# Patient Record
Sex: Male | Born: 1960 | Hispanic: No | Marital: Married | Smoking: Former smoker
Health system: Southern US, Community
[De-identification: ages and names within clinical notes are randomized; demographics above are authoritative.]

## PROBLEM LIST (undated history)

## (undated) DIAGNOSIS — N189 Chronic kidney disease, unspecified: Secondary | ICD-10-CM

## (undated) DIAGNOSIS — I10 Essential (primary) hypertension: Secondary | ICD-10-CM

## (undated) HISTORY — PX: APPENDECTOMY: SHX54

## (undated) HISTORY — DX: Chronic kidney disease, unspecified: N18.9

## (undated) HISTORY — DX: Essential (primary) hypertension: I10

---

## 1998-02-08 ENCOUNTER — Emergency Department (HOSPITAL_COMMUNITY): Admission: EM | Admit: 1998-02-08 | Discharge: 1998-02-08 | Payer: Self-pay | Admitting: Emergency Medicine

## 1998-02-09 ENCOUNTER — Encounter: Admission: RE | Admit: 1998-02-09 | Discharge: 1998-05-10 | Payer: Self-pay | Admitting: Internal Medicine

## 1998-02-12 ENCOUNTER — Encounter: Admission: RE | Admit: 1998-02-12 | Discharge: 1998-02-12 | Payer: Self-pay | Admitting: *Deleted

## 2002-06-03 ENCOUNTER — Emergency Department (HOSPITAL_COMMUNITY): Admission: EM | Admit: 2002-06-03 | Discharge: 2002-06-03 | Payer: Self-pay | Admitting: Emergency Medicine

## 2002-06-03 ENCOUNTER — Encounter: Payer: Self-pay | Admitting: Emergency Medicine

## 2003-03-26 ENCOUNTER — Emergency Department (HOSPITAL_COMMUNITY): Admission: EM | Admit: 2003-03-26 | Discharge: 2003-03-27 | Payer: Self-pay | Admitting: Emergency Medicine

## 2010-04-07 ENCOUNTER — Observation Stay (HOSPITAL_COMMUNITY): Admission: EM | Admit: 2010-04-07 | Discharge: 2010-04-08 | Payer: Self-pay | Admitting: Emergency Medicine

## 2010-04-07 ENCOUNTER — Encounter (INDEPENDENT_AMBULATORY_CARE_PROVIDER_SITE_OTHER): Payer: Self-pay | Admitting: General Surgery

## 2010-09-26 LAB — BASIC METABOLIC PANEL
BUN: 9 mg/dL (ref 6–23)
CO2: 26 mEq/L (ref 19–32)
Calcium: 9 mg/dL (ref 8.4–10.5)
Chloride: 103 mEq/L (ref 96–112)
Creatinine, Ser: 0.78 mg/dL (ref 0.4–1.5)
GFR calc Af Amer: >60 mL/min (ref >60)
GFR calc non Af Amer: >60 mL/min (ref >60)
Glucose, Bld: 113 mg/dL — ABNORMAL HIGH (ref 70–99)
Potassium: 3.7 mEq/L (ref 3.5–5.1)
Sodium: 137 mEq/L (ref 135–145)

## 2010-09-26 LAB — TYPE AND SCREEN
ABO/RH(D): A POS
Antibody Screen: NEGATIVE

## 2010-09-26 LAB — ABO/RH: ABO/RH(D): A POS

## 2013-01-01 ENCOUNTER — Ambulatory Visit: Payer: BC Managed Care – PPO

## 2013-01-01 ENCOUNTER — Ambulatory Visit (INDEPENDENT_AMBULATORY_CARE_PROVIDER_SITE_OTHER): Payer: BC Managed Care – PPO | Admitting: Emergency Medicine

## 2013-01-01 VITALS — BP 159/78 | HR 59 | Temp 97.9°F | Resp 18 | Ht 63.0 in | Wt 132.4 lb

## 2013-01-01 DIAGNOSIS — M1990 Unspecified osteoarthritis, unspecified site: Secondary | ICD-10-CM

## 2013-01-01 DIAGNOSIS — M25562 Pain in left knee: Secondary | ICD-10-CM

## 2013-01-01 DIAGNOSIS — M25569 Pain in unspecified knee: Secondary | ICD-10-CM

## 2013-01-01 MED ORDER — MELOXICAM 15 MG PO TABS: 15.0000 mg | ORAL_TABLET | Freq: Every day | ORAL | Status: DC

## 2013-01-01 NOTE — Patient Instructions (Addendum)
Knee Wear and Tear Disorders of the Knee (Arthritis, Osteoarthritis) Everyone will experience wear and tear injuries (arthritis, osteoarthritis) of the knee. These are the changes we all get as we age. They come from the joint stress of daily living. The amount of cartilage damage in your knee and your symptoms determine if you need surgery. Mild problems require approximately two months recovery time. More severe problems take several months to recover. With mild problems, your surgeon may find worn and rough cartilage surfaces. With severe changes, your surgeon may find cartilage that has completely worn away and exposed the bone. Loose bodies of bone and cartilage, bone spurs (excess bone growth), and injuries to the menisci (cushions between the large bones of your leg) are also common. All of these problems can cause pain. For a mild wear and tear problem, rough cartilage may simply need to be shaved and smoothed. For more severe problems with areas of exposed bone, your surgeon may use an instrument for roughing up the bone surfaces to stimulate new cartilage growth. Loose bodies are usually removed. Torn menisci may be trimmed or repaired. ABOUT THE ARTHROSCOPIC PROCEDURE Arthroscopy is a surgical technique. It allows your orthopedic surgeon to diagnose and treat your knee injury with accuracy. The surgeon looks into your knee through a small scope. The scope is like a small (pencil-sized) telescope. Arthroscopy is less invasive than open knee surgery. You can expect a more rapid recovery. After the procedure, you will be moved to a recovery area until most of the effects of the medication have worn off. Your caregiver will discuss the test results with you. RECOVERY The severity of the arthritis and the type of procedure performed will determine recovery time. Other important factors include age, physical condition, medical conditions, and the type of rehabilitation program. Strengthening your muscles  after arthroscopy helps guarantee a better recovery. Follow your caregiver's instructions. Use crutches, rest, elevate, ice, and do knee exercises as instructed. Your caregivers will help you and instruct you with exercises and other physical therapy required to regain your mobility, muscle strength, and functioning following surgery. Only take over-the-counter or prescription medicines for pain, discomfort, or fever as directed by your caregiver.  SEEK MEDICAL CARE IF:   There is increased bleeding (more than a small spot) from the wound.  You notice redness, swelling, or increasing pain in the wound.  Pus is coming from wound.  You develop an unexplained oral temperature above 102 F (38.9 C) , or as your caregiver suggests.  You notice a foul smell coming from the wound or dressing.  You have severe pain with motion of the knee. SEEK IMMEDIATE MEDICAL CARE IF:   You develop a rash.  You have difficulty breathing.  You have any allergic problems. MAKE SURE YOU:   Understand these instructions.  Will watch your condition.  Will get help right away if you are not doing well or get worse. Document Released: 06/27/2000 Document Revised: 09/22/2011 Document Reviewed: 11/24/2007 [facility] Patient Information 2014 [place], [place].

## 2013-01-01 NOTE — Progress Notes (Signed)
[facility] [address], [place] [address] [phone]  Date:  01/01/2013   Name:  George Hodges   DOB:  09/10/1960   MRN:  [id number]  PCP:  No primary provider on file.    Chief Complaint: Knee Pain   History of Present Illness:  George Hodges is a 52 y.o. very pleasant male patient who presents with the following:  Works and has pain in left knee worse when he descends stairs.  No history of injury.  Says knee swells but does not lock or click.  No pain with weight bearing.  No improvement with over the counter medications or other home remedies. Denies other complaint or health concern today.   There are no active problems to display for this patient.   History reviewed. No pertinent past medical history.  Past Surgical History  Procedure Laterality Date  . Appendectomy      History  Substance Use Topics  . Smoking status: Former Games developer  . Smokeless tobacco: Never Used  . Alcohol Use: Yes     Comment: 1-2 cans of beer daily    No family history on file.  No Known Allergies  Medication list has been reviewed and updated.  No current outpatient prescriptions on file prior to visit.   No current facility-administered medications on file prior to visit.    Review of Systems:  As per HPI, otherwise negative.    Physical Examination: Filed Vitals:   01/01/13 1223  BP: 159/78  Pulse: 59  Temp: 97.9 F (36.6 C)  Resp: 18   Filed Vitals:   01/01/13 1223  Height: 5\' 3"  (1.6 m)  Weight: 132 lb 6.4 oz (60.056 kg)   Body mass index is 23.46 kg/(m^2). Ideal Body Weight: Weight in (lb) to have BMI = 25: 140.8   GEN: WDWN, NAD, Non-toxic, Alert & Oriented x 3 HEENT: Atraumatic, Normocephalic.  Ears and Nose: No external deformity. EXTR: No clubbing/cyanosis/edema NEURO: Normal gait.  PSYCH: Normally interactive. Conversant. Not depressed or anxious appearing.  Calm demeanor.  KNEE:  Left warm and minimally tender.  Full AROM and PROM.   Joint stable.  Assessment and Plan: Degenerative arthritis knee mobic Follow up in one month  [facility] reading (PRIMARY) by  Dr. Dareen Piano.  No osseous injury.   Signed,  Phillips Odor, MD

## 2014-03-08 ENCOUNTER — Encounter (HOSPITAL_COMMUNITY): Payer: BC Managed Care – PPO | Admitting: Certified Registered Nurse Anesthetist

## 2014-03-08 ENCOUNTER — Encounter (HOSPITAL_COMMUNITY)
Admission: EM | Disposition: A | Discharge status: Home / Self Care | Payer: Self-pay | Source: Home / Self Care | Attending: Emergency Medicine

## 2014-03-08 ENCOUNTER — Ambulatory Visit (HOSPITAL_COMMUNITY)
Admission: EM | Admit: 2014-03-08 | Discharge: 2014-03-08 | Disposition: A | Discharge status: Home / Self Care | Payer: BC Managed Care – PPO | Attending: Emergency Medicine | Admitting: Emergency Medicine

## 2014-03-08 ENCOUNTER — Emergency Department (HOSPITAL_COMMUNITY): Payer: BC Managed Care – PPO

## 2014-03-08 ENCOUNTER — Emergency Department (HOSPITAL_COMMUNITY): Payer: BC Managed Care – PPO | Admitting: Certified Registered Nurse Anesthetist

## 2014-03-08 ENCOUNTER — Encounter (HOSPITAL_COMMUNITY): Payer: Self-pay | Admitting: Emergency Medicine

## 2014-03-08 DIAGNOSIS — S66909A Unspecified injury of unspecified muscle, fascia and tendon at wrist and hand level, unspecified hand, initial encounter: Secondary | ICD-10-CM | POA: Diagnosis present

## 2014-03-08 DIAGNOSIS — S5420XA Injury of radial nerve at forearm level, unspecified arm, initial encounter: Secondary | ICD-10-CM | POA: Insufficient documentation

## 2014-03-08 DIAGNOSIS — Y92009 Unspecified place in unspecified non-institutional (private) residence as the place of occurrence of the external cause: Secondary | ICD-10-CM | POA: Insufficient documentation

## 2014-03-08 DIAGNOSIS — Z87891 Personal history of nicotine dependence: Secondary | ICD-10-CM | POA: Diagnosis not present

## 2014-03-08 DIAGNOSIS — S62102B Fracture of unspecified carpal bone, left wrist, initial encounter for open fracture: Secondary | ICD-10-CM

## 2014-03-08 DIAGNOSIS — S61509A Unspecified open wound of unspecified wrist, initial encounter: Secondary | ICD-10-CM | POA: Insufficient documentation

## 2014-03-08 DIAGNOSIS — S52599B Other fractures of lower end of unspecified radius, initial encounter for open fracture type I or II: Secondary | ICD-10-CM | POA: Insufficient documentation

## 2014-03-08 DIAGNOSIS — S61512A Laceration without foreign body of left wrist, initial encounter: Secondary | ICD-10-CM

## 2014-03-08 DIAGNOSIS — W312XXA Contact with powered woodworking and forming machines, initial encounter: Secondary | ICD-10-CM | POA: Diagnosis not present

## 2014-03-08 DIAGNOSIS — S66922A Laceration of unspecified muscle, fascia and tendon at wrist and hand level, left hand, initial encounter: Secondary | ICD-10-CM

## 2014-03-08 HISTORY — PX: TENDON REPAIR: SHX5111

## 2014-03-08 LAB — CBC WITH DIFFERENTIAL/PLATELET
BASOS ABS: 0 10*3/uL (ref 0.0–0.1)
Basophils Relative: 0 % (ref 0–1)
Eosinophils Absolute: 0.2 10*3/uL (ref 0.0–0.7)
Eosinophils Relative: 2 % (ref 0–5)
HEMATOCRIT: 43.8 % (ref 39.0–52.0)
HEMOGLOBIN: 14.4 g/dL (ref 13.0–17.0)
LYMPHS ABS: 2 10*3/uL (ref 0.7–4.0)
LYMPHS PCT: 25 % (ref 12–46)
MCH: 27.5 pg (ref 26.0–34.0)
MCHC: 32.9 g/dL (ref 30.0–36.0)
MCV: 83.6 fL (ref 78.0–100.0)
MONO ABS: 0.8 10*3/uL (ref 0.1–1.0)
MONOS PCT: 10 % (ref 3–12)
NEUTROS ABS: 4.8 10*3/uL (ref 1.7–7.7)
Neutrophils Relative %: 63 % (ref 43–77)
Platelets: 197 10*3/uL (ref 150–400)
RBC: 5.24 MIL/uL (ref 4.22–5.81)
RDW: 13 % (ref 11.5–15.5)
WBC: 7.8 10*3/uL (ref 4.0–10.5)

## 2014-03-08 LAB — BASIC METABOLIC PANEL
ANION GAP: 14 (ref 5–15)
BUN: 16 mg/dL (ref 6–23)
CHLORIDE: 102 meq/L (ref 96–112)
CO2: 24 meq/L (ref 19–32)
CREATININE: 0.9 mg/dL (ref 0.50–1.35)
Calcium: 9.5 mg/dL (ref 8.4–10.5)
GFR calc Af Amer: >90 mL/min (ref >90)
GFR calc non Af Amer: >90 mL/min (ref >90)
GLUCOSE: 88 mg/dL (ref 70–99)
Potassium: 4.1 mEq/L (ref 3.7–5.3)
Sodium: 140 mEq/L (ref 137–147)

## 2014-03-08 SURGERY — TENDON REPAIR
Anesthesia: General | Site: Wrist | Laterality: Left

## 2014-03-08 MED ORDER — LACTATED RINGERS IV SOLN
INTRAVENOUS | Status: DC
  Administered 2014-03-08: 16:00:00 via INTRAVENOUS

## 2014-03-08 MED ORDER — CEFAZOLIN SODIUM 1-5 GM-% IV SOLN
INTRAVENOUS | Status: AC
  Filled 2014-03-08: qty 50

## 2014-03-08 MED ORDER — LIDOCAINE HCL (CARDIAC) 20 MG/ML IV SOLN
INTRAVENOUS | Status: AC
  Filled 2014-03-08: qty 5

## 2014-03-08 MED ORDER — GLYCOPYRROLATE 0.2 MG/ML IJ SOLN
INTRAMUSCULAR | Status: AC
  Filled 2014-03-08: qty 1

## 2014-03-08 MED ORDER — ONDANSETRON HCL 4 MG/2ML IJ SOLN
4.0000 mg | Freq: Once | INTRAMUSCULAR | Status: AC
  Administered 2014-03-08: 4 mg via INTRAVENOUS
  Filled 2014-03-08: qty 2

## 2014-03-08 MED ORDER — GLYCOPYRROLATE 0.2 MG/ML IJ SOLN
INTRAMUSCULAR | Status: DC | PRN
  Administered 2014-03-08 (×2): 0.1 mg via INTRAVENOUS

## 2014-03-08 MED ORDER — MORPHINE SULFATE 4 MG/ML IJ SOLN
4.0000 mg | Freq: Once | INTRAMUSCULAR | Status: AC
  Administered 2014-03-08: 4 mg via INTRAVENOUS
  Filled 2014-03-08: qty 1

## 2014-03-08 MED ORDER — FENTANYL CITRATE 0.05 MG/ML IJ SOLN
INTRAMUSCULAR | Status: AC
  Filled 2014-03-08: qty 2

## 2014-03-08 MED ORDER — FENTANYL CITRATE 0.05 MG/ML IJ SOLN
25.0000 ug | INTRAMUSCULAR | Status: DC | PRN
  Administered 2014-03-08: 50 ug via INTRAVENOUS
  Administered 2014-03-08: 25 ug via INTRAVENOUS
  Administered 2014-03-08: 50 ug via INTRAVENOUS

## 2014-03-08 MED ORDER — FENTANYL CITRATE 0.05 MG/ML IJ SOLN
INTRAMUSCULAR | Status: AC
  Filled 2014-03-08: qty 5

## 2014-03-08 MED ORDER — MIDAZOLAM HCL 2 MG/2ML IJ SOLN
INTRAMUSCULAR | Status: AC
  Filled 2014-03-08: qty 2

## 2014-03-08 MED ORDER — LACTATED RINGERS IV SOLN
INTRAVENOUS | Status: DC | PRN
  Administered 2014-03-08 (×2): via INTRAVENOUS

## 2014-03-08 MED ORDER — OXYCODONE HCL 5 MG PO TABS
ORAL_TABLET | ORAL | Status: AC
  Filled 2014-03-08: qty 1

## 2014-03-08 MED ORDER — PROPOFOL 10 MG/ML IV BOLUS
INTRAVENOUS | Status: AC
  Filled 2014-03-08: qty 20

## 2014-03-08 MED ORDER — PHENYLEPHRINE HCL 10 MG/ML IJ SOLN
INTRAMUSCULAR | Status: DC | PRN
  Administered 2014-03-08 (×2): 80 ug via INTRAVENOUS
  Administered 2014-03-08: 40 ug via INTRAVENOUS
  Administered 2014-03-08 (×3): 80 ug via INTRAVENOUS

## 2014-03-08 MED ORDER — OXYCODONE HCL 5 MG PO TABS
5.0000 mg | ORAL_TABLET | Freq: Once | ORAL | Status: AC | PRN
  Administered 2014-03-08: 5 mg via ORAL

## 2014-03-08 MED ORDER — MIDAZOLAM HCL 5 MG/5ML IJ SOLN
INTRAMUSCULAR | Status: DC | PRN
  Administered 2014-03-08 (×2): 1 mg via INTRAVENOUS

## 2014-03-08 MED ORDER — PROPOFOL 10 MG/ML IV BOLUS
INTRAVENOUS | Status: DC | PRN
  Administered 2014-03-08: 150 mg via INTRAVENOUS

## 2014-03-08 MED ORDER — PHENYLEPHRINE 40 MCG/ML (10ML) SYRINGE FOR IV PUSH (FOR BLOOD PRESSURE SUPPORT)
PREFILLED_SYRINGE | INTRAVENOUS | Status: AC
  Filled 2014-03-08: qty 10

## 2014-03-08 MED ORDER — CEPHALEXIN 500 MG PO CAPS
500.0000 mg | ORAL_CAPSULE | Freq: Four times a day (QID) | ORAL | Status: DC
Start: 2014-03-08 — End: 2015-07-23

## 2014-03-08 MED ORDER — BUPIVACAINE HCL 0.25 % IJ SOLN
INTRAMUSCULAR | Status: DC | PRN
  Administered 2014-03-08: 10 mL

## 2014-03-08 MED ORDER — ONDANSETRON HCL 4 MG/2ML IJ SOLN
INTRAMUSCULAR | Status: DC | PRN
  Administered 2014-03-08: 4 mg via INTRAVENOUS

## 2014-03-08 MED ORDER — CEFAZOLIN SODIUM 1-5 GM-% IV SOLN
1.0000 g | Freq: Once | INTRAVENOUS | Status: AC
  Administered 2014-03-08: 1 g via INTRAVENOUS
  Filled 2014-03-08: qty 50

## 2014-03-08 MED ORDER — VITAMIN C 500 MG PO TABS: 500.0000 mg | ORAL_TABLET | Freq: Every day | ORAL | Status: DC

## 2014-03-08 MED ORDER — SODIUM CHLORIDE 0.9 % IR SOLN
Status: DC | PRN
  Administered 2014-03-08: 1000 mL

## 2014-03-08 MED ORDER — OXYCODONE HCL 5 MG/5ML PO SOLN: 5.0000 mg | Freq: Once | ORAL | Status: AC | PRN

## 2014-03-08 MED ORDER — SODIUM CHLORIDE 0.9 % IJ SOLN
INTRAMUSCULAR | Status: AC
  Filled 2014-03-08: qty 10

## 2014-03-08 MED ORDER — NEOSTIGMINE METHYLSULFATE 10 MG/10ML IV SOLN
INTRAVENOUS | Status: AC
  Filled 2014-03-08: qty 1

## 2014-03-08 MED ORDER — BUPIVACAINE HCL (PF) 0.25 % IJ SOLN
INTRAMUSCULAR | Status: AC
  Filled 2014-03-08: qty 30

## 2014-03-08 MED ORDER — FENTANYL CITRATE 0.05 MG/ML IJ SOLN
INTRAMUSCULAR | Status: DC | PRN
  Administered 2014-03-08: 50 ug via INTRAVENOUS
  Administered 2014-03-08: 25 ug via INTRAVENOUS
  Administered 2014-03-08: 50 ug via INTRAVENOUS
  Administered 2014-03-08 (×3): 25 ug via INTRAVENOUS
  Administered 2014-03-08: 50 ug via INTRAVENOUS

## 2014-03-08 MED ORDER — SUCCINYLCHOLINE CHLORIDE 20 MG/ML IJ SOLN
INTRAMUSCULAR | Status: AC
  Filled 2014-03-08: qty 1

## 2014-03-08 MED ORDER — ROCURONIUM BROMIDE 50 MG/5ML IV SOLN
INTRAVENOUS | Status: AC
  Filled 2014-03-08: qty 1

## 2014-03-08 MED ORDER — CHLORHEXIDINE GLUCONATE 4 % EX LIQD
60.0000 mL | Freq: Once | CUTANEOUS | Status: DC
  Filled 2014-03-08: qty 60

## 2014-03-08 MED ORDER — LIDOCAINE HCL (CARDIAC) 20 MG/ML IV SOLN
INTRAVENOUS | Status: DC | PRN
  Administered 2014-03-08: 50 mg via INTRAVENOUS

## 2014-03-08 MED ORDER — TETANUS-DIPHTH-ACELL PERTUSSIS 5-2.5-18.5 LF-MCG/0.5 IM SUSP
0.5000 mL | Freq: Once | INTRAMUSCULAR | Status: AC
  Administered 2014-03-08: 0.5 mL via INTRAMUSCULAR
  Filled 2014-03-08: qty 0.5

## 2014-03-08 MED ORDER — GLYCOPYRROLATE 0.2 MG/ML IJ SOLN
INTRAMUSCULAR | Status: AC
  Filled 2014-03-08: qty 3

## 2014-03-08 MED ORDER — OXYCODONE-ACETAMINOPHEN 5-325 MG PO TABS: 1.0000 | ORAL_TABLET | ORAL | Status: DC | PRN

## 2014-03-08 MED ORDER — ONDANSETRON HCL 4 MG/2ML IJ SOLN
INTRAMUSCULAR | Status: AC
  Filled 2014-03-08: qty 2

## 2014-03-08 MED ORDER — ONDANSETRON HCL 4 MG/2ML IJ SOLN: 4.0000 mg | Freq: Four times a day (QID) | INTRAMUSCULAR | Status: DC | PRN

## 2014-03-08 MED ORDER — DOCUSATE SODIUM 100 MG PO CAPS: 100.0000 mg | ORAL_CAPSULE | Freq: Two times a day (BID) | ORAL | Status: DC

## 2014-03-08 MED ORDER — CEFAZOLIN SODIUM-DEXTROSE 2-3 GM-% IV SOLR
2.0000 g | INTRAVENOUS | Status: AC
  Administered 2014-03-08: 1 g via INTRAVENOUS

## 2014-03-08 MED ORDER — EPHEDRINE SULFATE 50 MG/ML IJ SOLN
INTRAMUSCULAR | Status: AC
  Filled 2014-03-08: qty 1

## 2014-03-08 SURGICAL SUPPLY — 56 items
BANDAGE ELASTIC 3 VELCRO ST LF (GAUZE/BANDAGES/DRESSINGS) ×2 IMPLANT
BANDAGE ELASTIC 4 VELCRO ST LF (GAUZE/BANDAGES/DRESSINGS) IMPLANT
BNDG CMPR 9X4 STRL LF SNTH (GAUZE/BANDAGES/DRESSINGS) ×1
BNDG CMPR MD 5X2 ELC HKLP STRL (GAUZE/BANDAGES/DRESSINGS) ×1
BNDG COHESIVE 1X5 TAN STRL LF (GAUZE/BANDAGES/DRESSINGS) IMPLANT
BNDG ELASTIC 2 VLCR STRL LF (GAUZE/BANDAGES/DRESSINGS) ×2 IMPLANT
BNDG ESMARK 4X9 LF (GAUZE/BANDAGES/DRESSINGS) ×3 IMPLANT
BNDG GAUZE ELAST 4 BULKY (GAUZE/BANDAGES/DRESSINGS) ×2 IMPLANT
CORDS BIPOLAR (ELECTRODE) ×3 IMPLANT
COVER SURGICAL LIGHT HANDLE (MISCELLANEOUS) ×3 IMPLANT
CUFF TOURNIQUET SINGLE 18IN (TOURNIQUET CUFF) ×3 IMPLANT
CUFF TOURNIQUET SINGLE 24IN (TOURNIQUET CUFF) IMPLANT
DRAPE SURG 17X23 STRL (DRAPES) ×3 IMPLANT
DRSG ADAPTIC 3X8 NADH LF (GAUZE/BANDAGES/DRESSINGS) ×2 IMPLANT
GAUZE SPONGE 2X2 8PLY STRL LF (GAUZE/BANDAGES/DRESSINGS) IMPLANT
GAUZE SPONGE 4X4 12PLY STRL (GAUZE/BANDAGES/DRESSINGS) IMPLANT
GLOVE BIOGEL PI IND STRL 8.5 (GLOVE) ×1 IMPLANT
GLOVE BIOGEL PI INDICATOR 8.5 (GLOVE) ×2
GLOVE SURG ORTHO 8.0 STRL STRW (GLOVE) ×3 IMPLANT
GOWN STRL REUS W/ TWL LRG LVL3 (GOWN DISPOSABLE) ×2 IMPLANT
GOWN STRL REUS W/ TWL XL LVL3 (GOWN DISPOSABLE) ×1 IMPLANT
GOWN STRL REUS W/TWL LRG LVL3 (GOWN DISPOSABLE) ×6
GOWN STRL REUS W/TWL XL LVL3 (GOWN DISPOSABLE) ×3
KIT BASIN OR (CUSTOM PROCEDURE TRAY) ×3 IMPLANT
KIT ROOM TURNOVER OR (KITS) ×3 IMPLANT
MANIFOLD NEPTUNE II (INSTRUMENTS) ×1 IMPLANT
NDL HYPO 25GX1X1/2 BEV (NEEDLE) IMPLANT
NEEDLE HYPO 25GX1X1/2 BEV (NEEDLE) ×3 IMPLANT
NS IRRIG 1000ML POUR BTL (IV SOLUTION) ×3 IMPLANT
PACK ORTHO EXTREMITY (CUSTOM PROCEDURE TRAY) ×3 IMPLANT
PAD ARMBOARD 7.5X6 YLW CONV (MISCELLANEOUS) ×6 IMPLANT
PAD CAST 3X4 CTTN HI CHSV (CAST SUPPLIES) IMPLANT
PAD CAST 4YDX4 CTTN HI CHSV (CAST SUPPLIES) IMPLANT
PADDING CAST COTTON 3X4 STRL (CAST SUPPLIES) ×3
PADDING CAST COTTON 4X4 STRL (CAST SUPPLIES)
PROTECTOR NERVE AXOGARD 3.5X20 (Nerve Graft) ×2 IMPLANT
SOAP 2 % CHG 4 OZ (WOUND CARE) ×3 IMPLANT
SPECIMEN JAR SMALL (MISCELLANEOUS) ×1 IMPLANT
SPLINT FIBERGLASS 3X12 (CAST SUPPLIES) ×2 IMPLANT
SPONGE GAUZE 2X2 STER 10/PKG (GAUZE/BANDAGES/DRESSINGS)
SPONGE GAUZE 4X4 12PLY STER LF (GAUZE/BANDAGES/DRESSINGS) ×2 IMPLANT
SUCTION FRAZIER TIP 10 FR DISP (SUCTIONS) IMPLANT
SUT ETHILON 8 0 BV130 4 (SUTURE) ×2 IMPLANT
SUT FIBERWIRE 4-0 18 TAPR NDL (SUTURE) ×9
SUT MERSILENE 4 0 P 3 (SUTURE) IMPLANT
SUT PROLENE 4 0 PS 2 18 (SUTURE) ×2 IMPLANT
SUT VIC AB 2-0 CT1 27 (SUTURE) ×3
SUT VIC AB 2-0 CT1 TAPERPNT 27 (SUTURE) IMPLANT
SUTURE FIBERWR 4-0 18 TAPR NDL (SUTURE) IMPLANT
SYR CONTROL 10ML LL (SYRINGE) ×2 IMPLANT
TOWEL OR 17X24 6PK STRL BLUE (TOWEL DISPOSABLE) ×3 IMPLANT
TOWEL OR 17X26 10 PK STRL BLUE (TOWEL DISPOSABLE) ×3 IMPLANT
TUBE CONNECTING 12'X1/4 (SUCTIONS) ×1
TUBE CONNECTING 12X1/4 (SUCTIONS) ×1 IMPLANT
UNDERPAD 30X30 INCONTINENT (UNDERPADS AND DIAPERS) ×3 IMPLANT
WATER STERILE IRR 1000ML POUR (IV SOLUTION) ×1 IMPLANT

## 2014-03-08 NOTE — ED Provider Notes (Signed)
Medical screening examination/treatment/procedure(s) were conducted as a shared visit with non-physician practitioner(s) and myself.  I personally evaluated the patient during the encounter.   EKG Interpretation None      George Hodges is a 53 y.o. male here with L wrist injury from power saw. No other injuries. Tetanus update in the ED. On exam, there is complex laceration involving radial side of L wrist. I am able to palpate radial pulse. However, there are tendons exposed. Also slightly dec sensation dorsal aspect of thumb. Xray showed possible radius fracture. Concerned for open fracture. Given ancef. PA called Dr. Melvyn Novas who will take him to the OR.    Richardean Canal, MD 03/08/14 [phone]

## 2014-03-08 NOTE — Anesthesia Postprocedure Evaluation (Signed)
  Anesthesia Post-op Note  Patient: George Hodges  Procedure(s) Performed: Procedure(s): Left Wrist Wound Exploration with Tendon and Nerve Repair (Left)  Patient Location: PACU  Anesthesia Type:General  Level of Consciousness: awake, alert , oriented and patient cooperative  Airway and Oxygen Therapy: Patient Spontanous Breathing  Post-op Pain: mild  Post-op Assessment: Post-op Vital signs reviewed, Patient's Cardiovascular Status Stable, Respiratory Function Stable, Patent Airway and No signs of Nausea or vomiting  Post-op Vital Signs: stable  Last Vitals:  Filed Vitals:   03/08/14 1815  BP:   Pulse: 68  Temp:   Resp: 11    Complications: No apparent anesthesia complications

## 2014-03-08 NOTE — Progress Notes (Signed)
Call made to Daughter [name], she speaks fluent English, Pt. Is [place]  (pt. Speaks some english), wife of pt. Is [place]. Call to Inclusion dept. For interpretor for the wife at the pt.'s request. Not able to have [place] interpreter available. Cordination /w daughter [name]) over the telephone for operative consent, she is in agreeement with surgery, she states her father understands & agrees.  Pt. Signed consent  For surgery& he also signed a release for interpretor to be served by his daughter.

## 2014-03-08 NOTE — ED Notes (Signed)
Place suture cart outside door

## 2014-03-08 NOTE — Op Note (Signed)
George Hodges, George Hodges                   ACCOUNT NO.:  [id number]  MEDICAL RECORD NO.:  [id number]  LOCATION:  [facility]                         FACILITY:  [facility]  PHYSICIAN:  Madelynn Done, MD  DATE OF BIRTH:  [birth year]  DATE OF PROCEDURE:  03/08/2014 DATE OF DISCHARGE:  03/08/2014                              OPERATIVE REPORT   PREOPERATIVE DIAGNOSIS:  Left wrist laceration with tendon nerve involvement.  POSTOPERATIVE DIAGNOSIS:  Left wrist laceration with tendon nerve involvement.  ATTENDING PHYSICIAN:  Madelynn Done, MD, who was scrubbed and present for the entire procedure.  ASSISTANT SURGEON:  None.  ANESTHESIA:  General via LMA.  SURGICAL PROCEDURE: 1. Left wrist traumatic laceration repair 7.5 cm. 2. Left hand extensor pollicis brevis tendon repair. 3. Left hand abductor pollicis longus proper repair. 4. Left hand abductor pollicis longus digastric portion repair. 5. Left wrist extensor tendon, extensor of the forearm, flexor of the     forearm repair, brachioradialis. 6. Open treatment of debridement of skin, subcutaneous tissue, and     bone associated with open distal radius fracture extra-articular     fracture. 7. Open treatment of left wrist extra-articular distal radius fracture     without internal fixation. 8. Left wrist major peripheral nerve repair, superficial branch of the     sensory radial nerve repair.  SURGICAL INDICATIONS:  Mr. Laurich is a right-hand dominant 53 year old gentleman who sustained a injury while at home, a saw to the dorsal aspect of the distal forearm and wrist.  The patient was seen and evaluated in the emergency department and recommended to undergo the above procedure.  Risks, benefits, and alternatives were discussed in detail with the patient and signed informed consent was obtained.  Risks include, but not limited to bleeding, infection, damage to nearby nerves, arteries, or tendons, loss of motion of the wrist and  digits, incomplete relief of symptoms, and need for further surgical intervention.  DESCRIPTION OF PROCEDURE:  The patient was properly identified in the preoperative holding area and marked with a permanent marker made on the left wrist to indicate the correct operative site.  The patient was then brought back to the operating room, placed supine on the anesthesia room table where general anesthesia was administered.  The patient tolerated this well.  A well-padded tourniquet was then placed on the left brachium sealed with 1000 drape.  The left upper extremity was then prepped and draped in normal sterile fashion.  Time-out was called, the correct side was identified, and the procedure was then begun. Attention was then turned to the left wrist were the traumatic laceration was extended both proximally and distally and limb was then elevated and tourniquet insufflated.  Excisional debridement of the skin, subcutaneous tissue, and bone of the open fracture of the distal radius was carried out with the nice sharp knife scissors and rongeurs with excisional debridement removing the small loose bony fragments. The patient did have the distal radius fracture that was not displaced. This was treated in open fashion with debridement without internal fixation.  After excisional debridement of the devitalized tissue, attention was then turned to the tendon repair.  The patient's first dorsal compartment tendons were transected.  The EPB was then repaired with several figure-of-eight 4-0 FiberWire sutures.  The APL proper was then repaired with several horizontal mattress and figure-of-eight FiberWire sutures.  An digastric portion of the APL was also repaired. Three slips of the APL were then repaired with the 4-0 FiberWire suture figure-of-eight in a horizontal mattress sutures.  Prior to the repair of the first dorsal compartment tendons, the brachioradialis, the wrist and forearm flexor was  then repaired with a 2-0 Vicryl suture.  The wound was then thoroughly irrigated.  The superficial branch of the radial nerve was then carefully repaired using the loupe magnifications and the 8-0 nylon suture with direct epineural repair.  The AxoGen nerve wrap was then placed around it and surface was protected very directly over the repair.  The wound was then thoroughly irrigated.  The traumatic laceration was then repaired with 4-0 Vicryl Rapide sutures. Adaptic dressing and sterile compressive bandage were then applied.  The patient was placed in a well-padded thumb spica splint, extubated, and taken to recovery room in good condition.  POSTPROCEDURE PLAN:  The patient discharged home, seen back in the office in approximately 9-10 days for wound check, application and then begin a postoperative APL and EPB repair protocol with the therapist radiographs at the first visit.     Madelynn Done, MD     FWO/MEDQ  D:  03/08/2014  T:  03/08/2014  Job:  [id number]

## 2014-03-08 NOTE — Brief Op Note (Signed)
03/08/2014  4:36 PM  PATIENT:  George Hodges  53 y.o. male  PRE-OPERATIVE DIAGNOSIS:  Laceration Left Wrist  POST-OPERATIVE DIAGNOSIS:  Laceration Left Wrist  PROCEDURE:  Procedure(s): LACERATION AND REPAIR/ LEFT WRIST (Left)  SURGEON:  Surgeon(s) and Role:    * Sharma Covert, MD - Primary  PHYSICIAN ASSISTANT: NONE  ASSISTANTS: none   ANESTHESIA:   general  EBL:     BLOOD ADMINISTERED:none  DRAINS: none   LOCAL MEDICATIONS USED:  MARCAINE    and NONE  SPECIMEN:  No Specimen  DISPOSITION OF SPECIMEN:  N/A  COUNTS:  YES  TOURNIQUET:    DICTATION: .161096  PLAN OF CARE: Discharge to home after PACU  PATIENT DISPOSITION:  PACU - hemodynamically stable.   Delay start of Pharmacological VTE agent (>24hrs) due to surgical blood loss or risk of bleeding: not applicable

## 2014-03-08 NOTE — ED Provider Notes (Signed)
CSN: [id number]     Arrival date & time 03/08/14  1124 History   First MD Initiated Contact with Patient 03/08/14 1138     Chief Complaint  Patient presents with  . Extremity Laceration     (Consider location/radiation/quality/duration/timing/severity/associated sxs/prior Treatment) HPI  53 year old male presents for evaluation of wrist lac due to power saw. Hx obtain through pt but daughter who is at bedside who also helped with translation.  Approximately 1-2 hrs ago he was at home cutting and trimming tree with a power saw when he accidentally cut his L wrist.  Report acute onset of sharp, 10/10, non radiating pain to affected site.  Report numbness to L thumb s/p injury.  Pt is R handed, does not recall last tetanus shot, no other injury.  Bleeding was controlled.  Not on blood thinner medication.    History reviewed. No pertinent past medical history. Past Surgical History  Procedure Laterality Date  . Appendectomy     History reviewed. No pertinent family history. History  Substance Use Topics  . Smoking status: Former Games developer  . Smokeless tobacco: Never Used  . Alcohol Use: Yes     Comment: 1-2 cans of beer daily    Review of Systems  Constitutional: Negative for fever.  Skin: Positive for wound.  Neurological: Positive for numbness.      Allergies  Review of patient's allergies indicates no known allergies.  Home Medications   Prior to Admission medications   Medication Sig Start Date End Date Taking? Authorizing Provider  meloxicam (MOBIC) 15 MG tablet Take 1 tablet (15 mg total) by mouth daily. 01/01/13   Carmelina Dane, MD   BP 174/61  Pulse 67  Temp(Src) 97.6 F (36.4 C) (Oral)  Resp 19  Ht  (1.626 m)  Wt 135 lb (61.236 kg)  BMI 23.16 kg/m2  SpO2 98% Physical Exam  Constitutional: He appears well-developed and well-nourished. No distress.  HENT:  Head: Atraumatic.  Eyes: Conjunctivae are normal.  Neck: Normal range of motion. Neck supple.    Musculoskeletal:       Arms: Neurological: He is alert.  Skin: No rash noted.  L wrist: 6cm deep oblique laceration to medial region extending dorsally.  Laceration is adjacent to radial pulse without radial artery involvement. Brachialradialis tendon exposed and injured. Suspect flexor carpi radialis tendon injury. Loss of sensation to L thumb dorsally but normal sensation to palmar aspect.   Decreased L thumb ROM 2/2 pain.  No fb noted.    Psychiatric: He has a normal mood and affect.    ED Course  Procedures (including critical care time)  Pt with deep lac to L medial wrist extended dorsally.  Paresthesia to L thumb indicating suspect nerve injury. 2 tendons were exposed and lacerated.  Xray ordered, tdap given, ancef started, area was locally anesthetized for comfort.  Care discussed with Dr. Silverio Lay.  Plan to consult hand specialist for further care.  Pain medication given.    1:10 PM i have consulted with hand specialist, Dr. Melvyn Novas, who agrees to evaluate pt and will take pt to OR for repair. He request basic labs.  Will obtain CBC, BMP, pt kept NPO.  Wound dressed for protection.  Xray demonstrates soft tissue and distal left radial osseus injury to the radial border of the distal radius with multiple bone fragments identified at wound.            Labs Review Labs Reviewed  CBC WITH DIFFERENTIAL  BASIC  METABOLIC PANEL    Imaging Review Dg Wrist Complete Left  03/08/2014   CLINICAL DATA:  Power saw injury to LEFT wrist  EXAM: LEFT WRIST - COMPLETE 3+ VIEW  COMPARISON:  None  FINDINGS: Soft tissue deformity at the radial aspect of the RIGHT wrist to the level of the proximal carpal row and distal radius.  Multiple bone fragments are identified adjacent to the radial border of the distal radius compatible with osseous injury.  Joint spaces preserved.  No additional fracture, dislocation, or bone destruction.  IMPRESSION: Soft tissue and distal LEFT radial osseous injury to the  radial border of the distal radius with multiple bone fragments identified at wound.   Electronically Signed   By: Ulyses Southward M.D.   On: 03/08/2014 13:18     EKG Interpretation None      MDM   Final diagnoses:  Laceration of left wrist with tendon involvement, initial encounter  Open wrist fracture, left, initial encounter    BP 174/61  Pulse 67  Temp(Src) 97.6 F (36.4 C) (Oral)  Resp 19  Ht  (1.626 m)  Wt 135 lb (61.236 kg)  BMI 23.16 kg/m2  SpO2 98%  I have reviewed nursing notes and vital signs. I personally reviewed the imaging tests through PACS system  I reviewed available ER/hospitalization records thought the EMR     Fayrene Helper, [place] 03/08/14 1341

## 2014-03-08 NOTE — ED Notes (Signed)
Patient transported to X-ray 

## 2014-03-08 NOTE — Discharge Instructions (Signed)
KEEP BANDAGE CLEAN AND DRY CALL OFFICE FOR F/U APPT [phone] IN 8 DAYS ALSO CALL FOR THERAPY APPOINTMENT [phone] EXT 1601 KEEP HAND ELEVATED ABOVE HEART OK TO APPLY ICE TO OPERATIVE AREA CONTACT OFFICE IF ANY WORSENING PAIN OR CONCERNS.

## 2014-03-08 NOTE — H&P (Signed)
George Hodges is an 53 y.o. male.   Chief Complaint: Left wrist laceration HPI: Pt at home sustained injury to dorsum/radial aspect of left forearm Pt seen/evaluated in ED Pt here for surgery on left forearm No prior surgery to left arm/forearm  History reviewed. No pertinent past medical history.  Past Surgical History  Procedure Laterality Date  . Appendectomy      History reviewed. No pertinent family history. Social History:  reports that he has quit smoking. He has never used smokeless tobacco. He reports that he drinks alcohol. He reports that he does not use illicit drugs.  Allergies: No Known Allergies  No prescriptions prior to admission    Results for orders placed during the hospital encounter of 03/08/14 (from the past 48 hour(s))  CBC WITH DIFFERENTIAL     Status: None   Collection Time    03/08/14  1:09 PM      Result Value Ref Range   WBC 7.8  4.0 - 10.5 K/uL   RBC 5.24  4.22 - 5.81 MIL/uL   Hemoglobin 14.4  13.0 - 17.0 g/dL   HCT 43.8  39.0 - 52.0 %   MCV 83.6  78.0 - 100.0 fL   MCH 27.5  26.0 - 34.0 pg   MCHC 32.9  30.0 - 36.0 g/dL   RDW 13.0  11.5 - 15.5 %   Platelets 197  150 - 400 K/uL   Neutrophils Relative % 63  43 - 77 %   Neutro Abs 4.8  1.7 - 7.7 K/uL   Lymphocytes Relative 25  12 - 46 %   Lymphs Abs 2.0  0.7 - 4.0 K/uL   Monocytes Relative 10  3 - 12 %   Monocytes Absolute 0.8  0.1 - 1.0 K/uL   Eosinophils Relative 2  0 - 5 %   Eosinophils Absolute 0.2  0.0 - 0.7 K/uL   Basophils Relative 0  0 - 1 %   Basophils Absolute 0.0  0.0 - 0.1 K/uL  BASIC METABOLIC PANEL     Status: None   Collection Time    03/08/14  1:09 PM      Result Value Ref Range   Sodium 140  137 - 147 mEq/L   Potassium 4.1  3.7 - 5.3 mEq/L   Chloride 102  96 - 112 mEq/L   CO2 24  19 - 32 mEq/L   Glucose, Bld 88  70 - 99 mg/dL   BUN 16  6 - 23 mg/dL   Creatinine, Ser 0.90  0.50 - 1.35 mg/dL   Calcium 9.5  8.4 - 10.5 mg/dL   GFR calc non Af Amer >90  >90 mL/min   GFR calc  Af Amer >90  >90 mL/min   Comment: (NOTE)     The eGFR has been calculated using the CKD EPI equation.     This calculation has not been validated in all clinical situations.     eGFR's persistently <90 mL/min signify possible Chronic Kidney     Disease.   Anion gap 14  5 - 15   Dg Wrist Complete Left  03/08/2014   CLINICAL DATA:  Power saw injury to LEFT wrist  EXAM: LEFT WRIST - COMPLETE 3+ VIEW  COMPARISON:  None  FINDINGS: Soft tissue deformity at the radial aspect of the RIGHT wrist to the level of the proximal carpal row and distal radius.  Multiple bone fragments are identified adjacent to the radial border of the distal radius compatible with  osseous injury.  Joint spaces preserved.  No additional fracture, dislocation, or bone destruction.  IMPRESSION: Soft tissue and distal LEFT radial osseous injury to the radial border of the distal radius with multiple bone fragments identified at wound.   Electronically Signed   By: Lavonia Dana M.D.   On: 03/08/2014 13:18    ROS NO RECENT ILLNESSES OR HOSPITALIZATIONS  Blood pressure 158/66, pulse 56, temperature 98.1 F (36.7 C), temperature source Oral, resp. rate 19, height $RemoveBe'5\' 4"'qciKsUoWr$  (1.626 m), weight 61.236 kg (135 lb), SpO2 96.00%. Physical Exam  General Appearance:  Alert, cooperative, no distress, appears stated age  Head:  Normocephalic, without obvious abnormality, atraumatic  Eyes:  Pupils equal, conjunctiva/corneas clear,         Throat: Lips, mucosa, and tongue normal; teeth and gums normal  Neck: No visible masses     Lungs:   respirations unlabored  Chest Wall:  No tenderness or deformity  Heart:  Regular rate and rhythm,  Abdomen:   Soft, non-tender,         Extremities: LEFT FOREARM WOUND DOCUMENTED IN CHART BANDAGED NOT REMOVED FINGERS WARM WELL PERFUSED LIMITED THUMB MOBILITY AND POOR WRIST MOTION   Pulses: 2+ and symmetric  Skin: Skin color, texture, turgor normal, no rashes or lesions     Neurologic: Normal     Assessment/Plan LEFT FOREARM LACERATION WITH NERVE AND TENDON INVOLVEMENT  LEFT FOREARM WOUND EXPLORATION AND REPAIR OF INDICATED STRUCTURES  R/B/A DISCUSSED WITH PT IN OFFICE.  PT VOICED UNDERSTANDING OF PLAN CONSENT SIGNED DAY OF SURGERY PT SEEN AND EXAMINED PRIOR TO OPERATIVE PROCEDURE/DAY OF SURGERY SITE MARKED. QUESTIONS ANSWERED WILL GO HOME FOLLOWING SURGERY  WE ARE PLANNING SURGERY FOR YOUR UPPER EXTREMITY. THE RISKS AND BENEFITS OF SURGERY INCLUDE BUT NOT LIMITED TO BLEEDING INFECTION, DAMAGE TO NEARBY NERVES ARTERIES TENDONS, FAILURE OF SURGERY TO ACCOMPLISH ITS INTENDED GOALS, PERSISTENT SYMPTOMS AND NEED FOR FURTHER SURGICAL INTERVENTION. WITH THIS IN MIND WE WILL PROCEED. I HAVE DISCUSSED WITH THE PATIENT THE PRE AND POSTOPERATIVE REGIMEN AND THE DOS AND DON'TS. PT VOICED UNDERSTANDING AND INFORMED CONSENT SIGNED.  [name] 03/08/2014, 4:33 PM

## 2014-03-08 NOTE — ED Notes (Signed)
Per pt cut left wrist with power saw. Laceration to the bone with bleeding controled. CNS intact.

## 2014-03-08 NOTE — Transfer of Care (Signed)
Immediate Anesthesia Transfer of Care Note  Patient: George Hodges  Procedure(s) Performed: Procedure(s): Left Wrist Wound Exploration with Tendon and Nerve Repair (Left)  Patient Location: PACU  Anesthesia Type:General  Level of Consciousness: awake, alert , oriented, patient cooperative and responds to stimulation  Airway & Oxygen Therapy: Patient Spontanous Breathing and Patient connected to nasal cannula oxygen  Post-op Assessment: Report given to PACU RN and Post -op Vital signs reviewed and stable  Post vital signs: Reviewed and stable  Complications: No apparent anesthesia complications

## 2014-03-08 NOTE — Anesthesia Preprocedure Evaluation (Addendum)
Anesthesia Evaluation  Patient identified by MRN, date of birth, ID band Patient awake    Reviewed: Allergy & Precautions, H&P , NPO status , Patient's Chart, lab work & pertinent test results  Airway Mallampati: II TM Distance: >3 FB Neck ROM: Full    Dental  (+) Poor Dentition, Dental Advisory Given   Pulmonary former smoker,  breath sounds clear to auscultation        Cardiovascular negative cardio ROS  Rhythm:Regular Rate:Normal     Neuro/Psych negative neurological ROS     GI/Hepatic negative GI ROS, Neg liver ROS,   Endo/Other  negative endocrine ROS  Renal/GU negative Renal ROS     Musculoskeletal   Abdominal Normal abdominal exam  (+)   Peds  Hematology   Anesthesia Other Findings   Reproductive/Obstetrics                          Anesthesia Physical Anesthesia Plan  ASA: II  Anesthesia Plan: General   Post-op Pain Management:    Induction: Intravenous  Airway Management Planned: Oral ETT  Additional Equipment:   Intra-op Plan:   Post-operative Plan: Extubation in OR  Informed Consent: I have reviewed the patients History and Physical, chart, labs and discussed the procedure including the risks, benefits and alternatives for the proposed anesthesia with the patient or authorized representative who has indicated his/her understanding and acceptance.   Dental advisory given  Plan Discussed with: Anesthesiologist and Surgeon  Anesthesia Plan Comments:         Anesthesia Quick Evaluation

## 2014-03-09 NOTE — OR Nursing (Signed)
Late entry on 03-09-14 by Timoteo Expose, RN to add [employer] ID# to Nerve protector implant screen.

## 2014-03-10 ENCOUNTER — Encounter (HOSPITAL_COMMUNITY): Payer: Self-pay | Admitting: Orthopedic Surgery

## 2015-07-16 ENCOUNTER — Ambulatory Visit (INDEPENDENT_AMBULATORY_CARE_PROVIDER_SITE_OTHER): Payer: BLUE CROSS/BLUE SHIELD | Admitting: Family Medicine

## 2015-07-16 VITALS — BP 130/72 | HR 81 | Temp 98.6°F | Resp 18 | Ht 63.0 in | Wt 135.0 lb

## 2015-07-16 DIAGNOSIS — M5416 Radiculopathy, lumbar region: Secondary | ICD-10-CM

## 2015-07-16 DIAGNOSIS — M5417 Radiculopathy, lumbosacral region: Secondary | ICD-10-CM

## 2015-07-16 MED ORDER — PREDNISONE 20 MG PO TABS: ORAL_TABLET | ORAL | Status: DC

## 2015-07-16 MED ORDER — NAPROXEN 500 MG PO TABS: 500.0000 mg | ORAL_TABLET | Freq: Two times a day (BID) | ORAL | Status: DC

## 2015-07-16 MED ORDER — CYCLOBENZAPRINE HCL 5 MG PO TABS: 5.0000 mg | ORAL_TABLET | Freq: Three times a day (TID) | ORAL | Status: DC | PRN

## 2015-07-16 NOTE — Patient Instructions (Signed)
Take the muscle relaxant, cyclobenzaprine, 1 pill 3 times daily. When you return to work you can decrease the dose and just take it at bedtime.  Take the anti-inflammatory pain reliever, naproxen, 500 mg 2 times daily with breakfast and supper  Take the prednisone 20 mg after breakfast 3 pills daily for 2 days, then 2 daily for 2 days, then 1 daily for 2 days, then one half daily for 4 days.  Avoid heavy lifting and straining. However I do recommend that you do some gentle stretching exercises several times a day.  Plan to stay off work the next 4 days to let the medicine have a chance to be taking effect.  If not much improved by this weekend please return for a recheck. If worse at any time come back.

## 2015-07-16 NOTE — Progress Notes (Signed)
Patient ID: George Hodges, male    DOB: [birth year]  Age: 55 y.o. MRN: [id number]  Chief Complaint  Patient presents with  . Back Pain    x2 weeks   . Leg Pain    rt    Subjective:   55 year old man who comes in with two-week history of low back pain in the right low back radiating down the right leg. 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 With rain he has had to be standing and muddy water and eating. That has required extra exertion, and he thinks that may cause his injury. He did not have a specific single injury, just an overuse type problem with back pain. It is radiated down his right leg. They have used home remedy treating him with cupping. He is [place] from [place]  His daughter accompanying him here today. It has been maybe 10 years since he has had back problems.  Current allergies, medications, problem list, past/family and social histories reviewed.  Objective:  BP 130/72 mmHg  Pulse 81  Temp(Src) 98.6 F (37 C) (Oral)  Resp 18  Ht 5\' 3"  (1.6 m)  Wt 135 lb (61.236 kg)  BMI 23.92 kg/m2  SpO2 98%  No major acute distress. Abdomen is soft. He is not tender in the spine or main part of the back, but more down in the right SI joint and extreme low back. Straight leg raising test is positive on the right, though very limber he gets radicular pain when up to about 80. He has no pain with rotation of the hip or. Flexion of the hip. Deep tendon reflexes are 1+ and symmetrical in the knee and ankle. Sensory grossly normal. On flexion, extension, side-to-side tilt, and rotation he gets pain.  Assessment & Plan:   Assessment: 1. Lumbar back pain with radiculopathy affecting right lower extremity       Plan: Will treat with anti-inflammatory medication, muscle relaxant at night, and a tapering course of steroids. If he is not improving with that then might require additional workup.   Meds ordered this encounter  Medications  .  acetaminophen (TYLENOL) 500 MG tablet    Sig: Take 500 mg by mouth every 6 (six) hours as needed.  . predniSONE (DELTASONE) 20 MG tablet    Sig: Take 3 pills daily for 2 days, then 2 daily for 2 days, then 1 daily for 2 days, then one half daily. Best taken after breakfast.    Dispense:  14 tablet    Refill:  0  . naproxen (NAPROSYN) 500 MG tablet    Sig: Take 1 tablet (500 mg total) by mouth 2 (two) times daily with a meal.    Dispense:  30 tablet    Refill:  0  . cyclobenzaprine (FLEXERIL) 5 MG tablet    Sig: Take 1 tablet (5 mg total) by mouth 3 (three) times daily as needed for muscle spasms.    Dispense:  30 tablet    Refill:  1         Patient Instructions  Take the muscle relaxant, cyclobenzaprine, 1 pill 3 times daily. When you return to work you can decrease the dose and just take it at bedtime.  Take the anti-inflammatory pain reliever, naproxen, 500 mg 2 times daily with breakfast and supper  Take the prednisone 20 mg after breakfast 3 pills daily for 2 days, then 2 daily for 2 days, then 1 daily for 2 days,  then one half daily for 4 days.  Avoid heavy lifting and straining. However I do recommend that you do some gentle stretching exercises several times a day.  Plan to stay off work the next 4 days to let the medicine have a chance to be taking effect.  If not much improved by this weekend please return for a recheck. If worse at any time come back.     Return in about 5 days (around 07/21/2015), or if symptoms worsen or fail to improve.   Eloina Ergle, MD 07/16/2015

## 2015-07-23 ENCOUNTER — Ambulatory Visit (INDEPENDENT_AMBULATORY_CARE_PROVIDER_SITE_OTHER): Payer: BLUE CROSS/BLUE SHIELD | Admitting: Family Medicine

## 2015-07-23 VITALS — BP 122/72 | HR 79 | Temp 98.7°F | Resp 17 | Ht 65.0 in | Wt 134.0 lb

## 2015-07-23 DIAGNOSIS — M25561 Pain in right knee: Secondary | ICD-10-CM | POA: Diagnosis not present

## 2015-07-23 MED ORDER — MELOXICAM 15 MG PO TABS: 15.0000 mg | ORAL_TABLET | Freq: Every day | ORAL | Status: DC | PRN

## 2015-07-23 NOTE — Progress Notes (Signed)
   Subjective:    Patient ID: George Hodges, male    DOB: 1960-08-01, 55 y.o.   MRN: [id number]  HPI This is a pleasant 55 yo male who is accompanied by his daughter. He was seen last week with back pain and pain into right leg. He has had some improvement in his back pain, but right knee pain is worse and he feels like it is cold. Sitting and lying bothers his right hip and low back and standing and walking bother his knee. No falls. No weakness. Has some tingling. Pain is achy. He was given prednisone and is almost finished with taper. Takes naproxen (prn) and cyclobenzaprine (BID) with some relief. Pain has been worse with cold, wet weather. He works outside and Tax inspectordigs frequently, kneels on right knee.   No past medical history on file. Past Surgical History  Procedure Laterality Date  . Appendectomy    . Tendon repair Left 03/08/2014    Procedure: Left Wrist Wound Exploration with Tendon and Nerve Repair;  Surgeon: Sharma CovertFred W Ortmann, MD;  Location: [facility] OR;  Service: Orthopedics;  Laterality: Left;   No family history on file. Social History  Substance Use Topics  . Smoking status: Former Games developermoker  . Smokeless tobacco: Never Used  . Alcohol Use: Yes     Comment: 1-2 cans of beer daily   Review of Systems  Gastrointestinal: Negative for nausea and abdominal pain.  Musculoskeletal: Positive for back pain and arthralgias (right knee pain.).       Objective:   Physical Exam  Constitutional: He is oriented to person, place, and time. He appears well-developed and well-nourished.  HENT:  Head: Normocephalic and atraumatic.  Eyes: Conjunctivae are normal.  Neck: Normal range of motion. Neck supple.  Cardiovascular: Normal rate and regular rhythm.   Pulmonary/Chest: Effort normal.  Musculoskeletal: Normal range of motion.       Right knee: He exhibits normal range of motion, no swelling, no effusion, no ecchymosis, no deformity, no erythema, normal alignment, no LCL laxity, normal patellar  mobility and no MCL laxity. Tenderness (posterior knee) found.  Some popping with extension.   Neurological: He is alert and oriented to person, place, and time.  Skin: Skin is warm and dry.  Psychiatric: He has a normal mood and affect. His behavior is normal. Judgment and thought content normal.  Vitals reviewed.  BP 122/72 mmHg  Pulse 79  Temp(Src) 98.7 F (37.1 C) (Oral)  Resp 17  Ht 5\' 5"  (1.651 m)  Wt 134 lb (60.782 kg)  BMI 22.30 kg/m2  SpO2 99%     Assessment & Plan:  1. Right knee pain - Exam unremarkable other than pain to palpation of posterior knee. Suspect osteoarthritis with exacerbation due to cold, wet weather - He will finish prednisone that was prescribed for his back - Can try heat/ice for comfort, OTC sleeve type brace while working, encouraged regular gentle ROM, quadricept strengthening - meloxicam (MOBIC) 15 MG tablet; Take 1 tablet (15 mg total) by mouth daily as needed for pain. Do not take with naproxen or until prednisone finished.  Dispense: 30 tablet; Refill: 0 - He will let me know if he desires referral to ortho   Olean Reeeborah Gessner, FNP-BC  [facility]  07/23/2015 11:38 AM

## 2015-07-23 NOTE — Patient Instructions (Addendum)
Try an over the counter sleeve type brace for your knee Please let me know if not better in a couple of weeks- I can put in a referral to a bone specialist.  Knee Pain Knee pain is a very common symptom and can have many causes. Knee pain often goes away when you follow your health care provider's instructions for relieving pain and discomfort at home. However, knee pain can develop into a condition that needs treatment. Some conditions may include:  Arthritis caused by wear and tear (osteoarthritis).  Arthritis caused by swelling and irritation (rheumatoid arthritis or gout).  A cyst or growth in your knee.  An infection in your knee joint.  An injury that will not heal.  Damage, swelling, or irritation of the tissues that support your knee (torn ligaments or tendinitis). If your knee pain continues, additional tests may be ordered to diagnose your condition. Tests may include X-rays or other imaging studies of your knee. You may also need to have fluid removed from your knee. Treatment for ongoing knee pain depends on the cause, but treatment may include:  Medicines to relieve pain or swelling.  Steroid injections in your knee.  Physical therapy.  Surgery. HOME CARE INSTRUCTIONS  Take medicines only as directed by your health care provider.  Rest your knee and keep it raised (elevated) while you are resting.  Do not do things that cause or worsen pain.  Avoid high-impact activities or exercises, such as running, jumping rope, or doing jumping jacks.  Apply ice to the knee area:  Put ice in a plastic bag.  Place a towel between your skin and the bag.  Leave the ice on for 20 minutes, 2-3 times a day.  Ask your health care provider if you should wear an elastic knee support.  Keep a pillow under your knee when you sleep.  Lose weight if you are overweight. Extra weight can put pressure on your knee.  Do not use any tobacco products, including cigarettes, chewing  tobacco, or electronic cigarettes. If you need help quitting, ask your health care provider. Smoking may slow the healing of any bone and joint problems that you may have. SEEK MEDICAL CARE IF:  Your knee pain continues, changes, or gets worse.  You have a fever along with knee pain.  Your knee buckles or locks up.  Your knee becomes more swollen. SEEK IMMEDIATE MEDICAL CARE IF:   Your knee joint feels hot to the touch.  You have chest pain or trouble breathing.   This information is not intended to replace advice given to you by your health care provider. Make sure you discuss any questions you have with your health care provider.   Document Released: 04/27/2007 Document Revised: 07/21/2014 Document Reviewed: 02/13/2014 Elsevier Interactive Patient Education [employer]2016 Elsevier Inc.

## 2015-08-12 ENCOUNTER — Ambulatory Visit (INDEPENDENT_AMBULATORY_CARE_PROVIDER_SITE_OTHER): Payer: BLUE CROSS/BLUE SHIELD | Admitting: Family Medicine

## 2015-08-12 VITALS — BP 140/80 | HR 74 | Temp 98.4°F | Resp 17 | Ht 64.57 in | Wt 132.0 lb

## 2015-08-12 DIAGNOSIS — R05 Cough: Secondary | ICD-10-CM | POA: Diagnosis not present

## 2015-08-12 DIAGNOSIS — J0110 Acute frontal sinusitis, unspecified: Secondary | ICD-10-CM

## 2015-08-12 DIAGNOSIS — R030 Elevated blood-pressure reading, without diagnosis of hypertension: Secondary | ICD-10-CM

## 2015-08-12 DIAGNOSIS — R059 Cough, unspecified: Secondary | ICD-10-CM

## 2015-08-12 DIAGNOSIS — IMO0001 Reserved for inherently not codable concepts without codable children: Secondary | ICD-10-CM

## 2015-08-12 MED ORDER — HYDROCODONE-HOMATROPINE 5-1.5 MG/5ML PO SYRP: 5.0000 mL | ORAL_SOLUTION | Freq: Every evening | ORAL | Status: DC | PRN

## 2015-08-12 MED ORDER — BENZONATATE 100 MG PO CAPS: 100.0000 mg | ORAL_CAPSULE | Freq: Two times a day (BID) | ORAL | Status: DC | PRN

## 2015-08-12 MED ORDER — AMOXICILLIN-POT CLAVULANATE 875-125 MG PO TABS: 1.0000 | ORAL_TABLET | Freq: Two times a day (BID) | ORAL | Status: DC

## 2015-08-12 NOTE — Patient Instructions (Signed)

## 2015-08-12 NOTE — Progress Notes (Signed)
Chief Complaint:  Chief Complaint  Patient presents with  . Cough  . Shortness of Breath    HPI: George Hodges is a 55 y.o. male who reports to [facility] today complaining of sob and coughing,productive, he has had headache and fever 100.3 nand has taken delsyn, advil  Aches and pains.  He has frontal sinus pain. He also has left  Shoulder neck pain.   He has a cough that ckeep s him up at night. THe cpough is worse at night but is through tout day. He deneis f/c/n/v/abd pain or CP .  BP Readings from Last 3 Encounters:  08/12/15 140/80  07/23/15 122/72  07/16/15 130/72     No past medical history on file. Past Surgical History  Procedure Laterality Date  . Appendectomy    . Tendon repair Left 03/08/2014    Procedure: Left Wrist Wound Exploration with Tendon and Nerve Repair;  Surgeon: Sharma Covert, MD;  Location: [facility] OR;  Service: Orthopedics;  Laterality: Left;   Social History   Social History  . Marital Status: Married    Spouse Name: N/A  . Number of Children: N/A  . Years of Education: N/A   Social History Main Topics  . Smoking status: Former Games developer  . Smokeless tobacco: Never Used  . Alcohol Use: Yes     Comment: 1-2 cans of beer daily  . Drug Use: No  . Sexual Activity: Not Asked   Other Topics Concern  . None   Social History Narrative   No family history on file. No Known Allergies Prior to Admission medications   Not on File     ROS: The patient denies f/c, night sweats, unintentional weight loss, chest pain, palpitations, wheezing, dyspnea on exertion, nausea, vomiting, abdominal pain, dysuria, hematuria, melena, numbness, weakness, or tingling.   All other systems have been reviewed and were otherwise negative with the exception of those mentioned in the HPI and as above.    PHYSICAL EXAM: Filed Vitals:   08/12/15 1023 08/12/15 1108  BP: 152/90 140/80  Pulse: 74   Temp: 98.4 F (36.9 C)   Resp: 17    Body mass index is 22.26  kg/(m^2).   General: Alert, no acute distress HEENT:  Normocephalic, atraumatic, oropharynx patent. EOMI, PERRLA Erythematous throat, no exudates, TM normal, + sinus tenderness, + erythematous/boggy nasal mucosa Cardiovascular:  Regular rate and rhythm, no rubs murmurs or gallops.  No Carotid bruits, radial pulse intact. No pedal edema.  Respiratory: Clear to auscultation bilaterally.  No wheezes, rales, or rhonchi.  No cyanosis, no use of accessory musculature Abdominal: No organomegaly, abdomen is soft and non-tender, positive bowel sounds. No masses. Skin: No rashes. Neurologic: Facial musculature symmetric. Psychiatric: Patient acts appropriately throughout our interaction. Lymphatic: No cervical or submandibular lymphadenopathy Musculoskeletal: Gait intact. No edema, tenderness   LABS: Results for orders placed or performed during the hospital encounter of 03/08/14  CBC with Differential  Result Value Ref Range   WBC 7.8 4.0 - 10.5 K/uL   RBC 5.24 4.22 - 5.81 MIL/uL   Hemoglobin 14.4 13.0 - 17.0 g/dL   HCT 16.1 09.6 - 04.5 %   MCV 83.6 78.0 - 100.0 fL   MCH 27.5 26.0 - 34.0 pg   MCHC 32.9 30.0 - 36.0 g/dL   RDW 40.9 81.1 - 91.4 %   Platelets 197 150 - 400 K/uL   Neutrophils Relative % 63 43 - 77 %   Neutro Abs 4.8 1.7 -  7.7 K/uL   Lymphocytes Relative 25 12 - 46 %   Lymphs Abs 2.0 0.7 - 4.0 K/uL   Monocytes Relative 10 3 - 12 %   Monocytes Absolute 0.8 0.1 - 1.0 K/uL   Eosinophils Relative 2 0 - 5 %   Eosinophils Absolute 0.2 0.0 - 0.7 K/uL   Basophils Relative 0 0 - 1 %   Basophils Absolute 0.0 0.0 - 0.1 K/uL  Basic metabolic panel  Result Value Ref Range   Sodium 140 137 - 147 mEq/L   Potassium 4.1 3.7 - 5.3 mEq/L   Chloride 102 96 - 112 mEq/L   CO2 24 19 - 32 mEq/L   Glucose, Bld 88 70 - 99 mg/dL   BUN 16 6 - 23 mg/dL   Creatinine, Ser 9.62 0.50 - 1.35 mg/dL   Calcium 9.5 8.4 - 95.2 mg/dL   GFR calc non Af Amer >90 >90 mL/min   GFR calc Af Amer >90 >90 mL/min    Anion gap 14 5 - 15     EKG/XRAY:   Primary read interpreted by Dr. Conley Rolls at [facility].   ASSESSMENT/PLAN: Encounter Diagnoses  Name Primary?  . Cough   . Acute frontal sinusitis, recurrence not specified Yes  . Elevated blood pressure    History of elevated BP but not on meds Will monitor BP parameters given < 140/90 , he never had issues before, recheck was 140/80 Rx Augmentin, tessalon prn and hycodan prn   Gross sideeffects, risk and benefits, and alternatives of medications d/w patient. Patient is aware that all medications have potential sideeffects and we are unable to predict every sideeffect or drug-drug interaction that may occur.  Delron Comer DO  08/12/2015 12:21 PM

## 2015-09-17 ENCOUNTER — Telehealth: Payer: Self-pay | Admitting: Pulmonary Disease

## 2015-09-17 NOTE — Telephone Encounter (Signed)
LABS 03/08/14 CBC:  7.8/14.4/43.8/197 BMP:  140/4.1/102/24/16/0.90/88/9.5

## 2015-09-18 ENCOUNTER — Institutional Professional Consult (permissible substitution): Payer: Self-pay | Admitting: Pulmonary Disease

## 2015-09-21 ENCOUNTER — Ambulatory Visit (INDEPENDENT_AMBULATORY_CARE_PROVIDER_SITE_OTHER): Payer: BLUE CROSS/BLUE SHIELD | Admitting: Family Medicine

## 2015-09-21 ENCOUNTER — Ambulatory Visit (INDEPENDENT_AMBULATORY_CARE_PROVIDER_SITE_OTHER): Payer: BLUE CROSS/BLUE SHIELD

## 2015-09-21 VITALS — BP 130/60 | HR 71 | Temp 98.3°F | Resp 16 | Ht 65.0 in | Wt 133.0 lb

## 2015-09-21 DIAGNOSIS — R509 Fever, unspecified: Secondary | ICD-10-CM | POA: Diagnosis not present

## 2015-09-21 DIAGNOSIS — J189 Pneumonia, unspecified organism: Secondary | ICD-10-CM | POA: Diagnosis not present

## 2015-09-21 DIAGNOSIS — R05 Cough: Secondary | ICD-10-CM

## 2015-09-21 DIAGNOSIS — R059 Cough, unspecified: Secondary | ICD-10-CM

## 2015-09-21 LAB — POCT CBC
Granulocyte percent: 59 %G (ref 37–80)
HEMATOCRIT: 41.8 % — AB (ref 43.5–53.7)
Hemoglobin: 14 g/dL — AB (ref 14.1–18.1)
LYMPH, POC: 2.1 (ref 0.6–3.4)
MCH, POC: 27.7 pg (ref 27–31.2)
MCHC: 33.6 g/dL (ref 31.8–35.4)
MCV: 82.5 fL (ref 80–97)
MID (cbc): 0.5 (ref 0–0.9)
MPV: 8.6 fL (ref 0–99.8)
POC GRANULOCYTE: 3.8 (ref 2–6.9)
POC LYMPH %: 33.3 % (ref 10–50)
POC MID %: 7.7 %M (ref 0–12)
Platelet Count, POC: 130 10*3/uL — AB (ref 142–424)
RBC: 5.07 M/uL (ref 4.69–6.13)
RDW, POC: 13.4 %
WBC: 6.4 10*3/uL (ref 4.6–10.2)

## 2015-09-21 LAB — POCT INFLUENZA A/B
INFLUENZA A, POC: NEGATIVE
Influenza B, POC: NEGATIVE

## 2015-09-21 MED ORDER — AZITHROMYCIN 250 MG PO TABS: ORAL_TABLET | ORAL | Status: DC

## 2015-09-21 NOTE — Progress Notes (Signed)
Subjective:  By signing my name below, I, George Hodges, attest that this documentation has been prepared under the direction and in the presence of George StaggersJeffrey Draylen Lobue, MD.  Electronically Signed: Andrew Auaven Hodges, ED Scribe. 09/21/2015. 3:53 PM.   Patient ID: George Hodges, male    DOB: 1960/09/14, 55 y.o.   MRN: [id number]  HPI Chief Complaint  Patient presents with  . Chills    x1 month;  feels like flu  . Generalized Body Aches  . Emesis    happen last night  . Headache  . Cough   HPI Comments: George Hodges is a 55 y.o. male who presents to the [facility] complaining of a cough that began 1 month ago. Pt was seen in January for cough. He was diagnosed with a sinus infection which had gotten better. Pt was out of work from that infection and seen at [facility] for follow up. CXR at [facility] showed mass in chest. CT chest was order which showed LUQ calcification.  Cough returned a couple days ago along with a fever of 102, body aches and rhinorrhea. Today he developed HA and 1 episode of emesis after eating.    Pt does not speck english well. His hx was translated by daughters.   There are no active problems to display for this patient.  History reviewed. No pertinent past medical history. Past Surgical History  Procedure Laterality Date  . Appendectomy    . Tendon repair Left 03/08/2014    Procedure: Left Wrist Wound Exploration with Tendon and Nerve Repair;  Surgeon: George CovertFred W Ortmann, MD;  Location: [facility] OR;  Service: Orthopedics;  Laterality: Left;   No Known Allergies Prior to Admission medications   Not on File   Social History   Social History  . Marital Status: Married    Spouse Name: N/A  . Number of Children: N/A  . Years of Education: N/A   Occupational History  . Not on file.   Social History Main Topics  . Smoking status: Former Games developermoker  . Smokeless tobacco: Never Used  . Alcohol Use: Yes     Comment: 1-2 cans of beer daily  . Drug Use: No  . Sexual Activity:  Not on file   Other Topics Concern  . Not on file   Social History Narrative   Review of Systems  Constitutional: Positive for fever. Negative for chills.  HENT: Positive for rhinorrhea.   Respiratory: Positive for cough.   Gastrointestinal: Positive for vomiting.  Musculoskeletal: Positive for myalgias.  Neurological: Positive for headaches.       Objective:   Physical Exam  Constitutional: He is oriented to person, place, and time. He appears well-developed and well-nourished. No distress.  HENT:  Head: Normocephalic and atraumatic.  Mouth/Throat: Oropharynx is clear and moist.  Eyes: Conjunctivae and EOM are normal.  Neck: Neck supple.  Cardiovascular: Normal rate.   Pulmonary/Chest: Effort normal.  Course breath sounds inferior left lobe and right middle.   Musculoskeletal: Normal range of motion.  Neurological: He is alert and oriented to person, place, and time.  Skin: Skin is warm and dry.  Psychiatric: He has a normal mood and affect. His behavior is normal.  Nursing note and vitals reviewed.   Filed Vitals:   09/21/15 1510  BP: 130/60  Pulse: 71  Temp: 98.3 F (36.8 C)  TempSrc: Oral  Resp: 16  Height: 5\' 5"  (1.651 m)  Weight: 133 lb (60.328 kg)  SpO2: 98%   Results  for orders placed or performed in visit on 09/21/15  POCT CBC  Result Value Ref Range   WBC 6.4 4.6 - 10.2 K/uL   Lymph, poc 2.1 0.6 - 3.4   POC LYMPH PERCENT 33.3 10 - 50 %L   MID (cbc) 0.5 0 - 0.9   POC MID % 7.7 0 - 12 %M   POC Granulocyte 3.8 2 - 6.9   Granulocyte percent 59.0 37 - 80 %G   RBC 5.07 4.69 - 6.13 M/uL   Hemoglobin 14.0 (A) 14.1 - 18.1 g/dL   HCT, POC 16.1 (A) 09.6 - 53.7 %   MCV 82.5 80 - 97 fL   MCH, POC 27.7 27 - 31.2 pg   MCHC 33.6 31.8 - 35.4 g/dL   RDW, POC 04.5 %   Platelet Count, POC 130 (A) 142 - 424 K/uL   MPV 8.6 0 - 99.8 fL  POCT Influenza A/B  Result Value Ref Range   Influenza A, POC Negative Negative   Influenza B, POC Negative Negative   Dg  Chest 2 View  09/21/2015  CLINICAL DATA:  Shortness of breath. EXAM: CHEST  2 VIEW COMPARISON:  No prior. FINDINGS: Mediastinum and hilar structures normal. Cardiomegaly with normal pulmonary vascularity. Mild infiltrate left upper lobe and right lung base cannot be excluded. No pleural effusion or pneumothorax IMPRESSION: 1. Mild infiltrate left upper lobe and right lung base cannot be excluded. 2.  Mild cardiomegaly. Electronically Signed   By: George Fus  Hodges   On: 09/21/2015 16:39   Assessment & Plan:   George Hodges is a 55 y.o. male Cough - Plan: POCT CBC, POCT Influenza A/B, DG Chest 2 View  Fever, unspecified - Plan: POCT CBC, POCT Influenza A/B, DG Chest 2 View  CAP (community acquired pneumonia) - Plan: azithromycin (ZITHROMAX) 250 MG tablet  Left upper lobe and right lung base possible pneumonia. Afebrile in the office, but preceding fever past 2 days. Reassuring CBC and negative flu test, but initial viral infection possible versus early community-acquired pneumonia. O2 sat stable, no red flags on exam.  -Start Z-Pak, Mucinex for cough, recheck in the next 3 days and less significantly improved. Follow-up chest x-ray for resolution in 4-6 weeks. Discussed with patient and family member. Understanding expressed. RTC/ER precautions.  Meds ordered this encounter  Medications  . azithromycin (ZITHROMAX) 250 MG tablet    Sig: Take 2 pills by mouth on day 1, then 1 pill by mouth per day on days 2 through 5.    Dispense:  6 tablet    Refill:  0   Patient Instructions  IF you received an x-ray today, you will receive an invoice from [facility]. Please contact [facility] at [phone] with questions or concerns regarding your invoice.   IF you received labwork today, you will receive an invoice from [employer]. Please contact [facility] at [phone] with questions or concerns regarding your invoice.   Our billing staff will not be able to  assist you with questions regarding bills from these companies.  You will be contacted with the lab results as soon as they are available. The fastest way to get your results is to activate your My Chart account. Instructions are located on the last page of this paperwork. If you have not heard from [place] regarding the results in 2 weeks, please contact this office.  X-ray indicates a possible pneumonia. Start azithromycin 2 pills today, one pill for the next 4 days. Mucinex or Mucinex  DM over-the-counter for cough, drink plenty of fluids and rest as needed. Follow-up in the next 3-4 days at the most if you're not significantly improved. Sooner or to emergency room if worse.  I recommend a repeat x-ray to make sure the areas seen today clear up in the next 4-6 weeks.  Community-Acquired Pneumonia, Adult Pneumonia is an infection of the lungs. There are different types of pneumonia. One type can develop while a person is in a hospital. A different type, called community-acquired pneumonia, develops in people who are not, or have not recently been, in the hospital or other health care facility.  CAUSES Pneumonia may be caused by bacteria, viruses, or funguses. Community-acquired pneumonia is often caused by Streptococcus pneumonia bacteria. These bacteria are often passed from one person to another by breathing in droplets from the cough or sneeze of an infected person. RISK FACTORS The condition is more likely to develop in:  People who havechronic diseases, such as chronic obstructive pulmonary disease (COPD), asthma, congestive heart failure, cystic fibrosis, diabetes, or kidney disease.  People who haveearly-stage or late-stage HIV.  People who havesickle cell disease.  People who havehad their spleen removed (splenectomy).  People who havepoor Administrator.  People who havemedical conditions that increase the risk of breathing in (aspirating) secretions their own mouth and nose.    People who havea weakened immune system (immunocompromised).  People who smoke.  People whotravel to areas where pneumonia-causing germs commonly exist.  People whoare around animal habitats or animals that have pneumonia-causing germs, including birds, bats, rabbits, cats, and farm animals. SYMPTOMS Symptoms of this condition include:  Adry cough.  A wet (productive) cough.  Fever.  Sweating.  Chest pain, especially when breathing deeply or coughing.  Rapid breathing or difficulty breathing.  Shortness of breath.  Shaking chills.  Fatigue.  Muscle aches. DIAGNOSIS Your health care provider will take a medical history and perform a physical exam. You may also have other tests, including:  Imaging studies of your chest, including X-rays.  Tests to check your blood oxygen level and other blood gases.  Other tests on blood, mucus (sputum), fluid around your lungs (pleural fluid), and urine. If your pneumonia is severe, other tests may be done to identify the specific cause of your illness. TREATMENT The type of treatment that you receive depends on many factors, such as the cause of your pneumonia, the medicines you take, and other medical conditions that you have. For most adults, treatment and recovery from pneumonia may occur at home. In some cases, treatment must happen in a hospital. Treatment may include:  Antibiotic medicines, if the pneumonia was caused by bacteria.  Antiviral medicines, if the pneumonia was caused by a virus.  Medicines that are given by mouth or through an IV tube.  Oxygen.  Respiratory therapy. Although rare, treating severe pneumonia may include:  Mechanical ventilation. This is done if you are not breathing well on your own and you cannot maintain a safe blood oxygen level.  Thoracentesis. This procedureremoves fluid around one lung or both lungs to help you breathe better. HOME CARE INSTRUCTIONS  Take over-the-counter and  prescription medicines only as told by your health care provider.  Only takecough medicine if you are losing sleep. Understand that cough medicine can prevent your body's natural ability to remove mucus from your lungs.  If you were prescribed an antibiotic medicine, take it as told by your health care provider. Do not stop taking the antibiotic even if you start  to feel better.  Sleep in a semi-upright position at night. Try sleeping in a reclining chair, or place a few pillows under your head.  Do not use tobacco products, including cigarettes, chewing tobacco, and e-cigarettes. If you need help quitting, ask your health care provider.  Drink enough water to keep your urine clear or pale yellow. This will help to thin out mucus secretions in your lungs. PREVENTION There are ways that you can decrease your risk of developing community-acquired pneumonia. Consider getting a pneumococcal vaccine if:  You are older than 57 years of age.  You are older than 57 years of age and are undergoing cancer treatment, have chronic lung disease, or have other medical conditions that affect your immune system. Ask your health care provider if this applies to you. There are different types and schedules of pneumococcal vaccines. Ask your health care provider which vaccination option is best for you. You may also prevent community-acquired pneumonia if you take these actions:  Get an influenza vaccine every year. Ask your health care provider which type of influenza vaccine is best for you.  Go to the dentist on a regular basis.  Wash your hands often. Use hand sanitizer if soap and water are not available. SEEK MEDICAL CARE IF:  You have a fever.  You are losing sleep because you cannot control your cough with cough medicine. SEEK IMMEDIATE MEDICAL CARE IF:  You have worsening shortness of breath.  You have increased chest pain.  Your sickness becomes worse, especially if you are an older adult  or have a weakened immune system.  You cough up blood.   This information is not intended to replace advice given to you by your health care provider. Make sure you discuss any questions you have with your health care provider.   Document Released: 06/30/2005 Document Revised: 03/21/2015 Document Reviewed: 10/25/2014 Elsevier Interactive Patient Education [employer]     I personally performed the services described in this documentation, which was scribed in my presence. The recorded information has been reviewed and considered, and addended by me as needed.

## 2015-09-21 NOTE — Patient Instructions (Addendum)
IF you received an x-ray today, you will receive an invoice from [facility]. Please contact [facility] at [phone] with questions or concerns regarding your invoice.   IF you received labwork today, you will receive an invoice from [facility]. Please contact [facility] at [phone] with questions or concerns regarding your invoice.   Our billing staff will not be able to assist you with questions regarding bills from these companies.  You will be contacted with the lab results as soon as they are available. The fastest way to get your results is to activate your My Chart account. Instructions are located on the last page of this paperwork. If you have not heard from us regarding the results in 2 weeks, please contact this office.  X-ray indicates a possible pneumonia. Start azithromycin 2 pills today, one pill for the next 4 days. Mucinex or Mucinex DM over-the-counter for cough, drink plenty of fluids and rest as needed. Follow-up in the next 3-4 days at the most if you're not significantly improved. Sooner or to emergency room if worse.  I recommend a repeat x-ray to make sure the areas seen today clear up in the next 4-6 weeks.  Community-Acquired Pneumonia, Adult Pneumonia is an infection of the lungs. There are different types of pneumonia. One type can develop while a person is in a hospital. A different type, called community-acquired pneumonia, develops in people who are not, or have not recently been, in the hospital or other health care facility.  CAUSES Pneumonia may be caused by bacteria, viruses, or funguses. Community-acquired pneumonia is often caused by Streptococcus pneumonia bacteria. These bacteria are often passed from one person to another by breathing in droplets from the cough or sneeze of an infected person. RISK FACTORS The condition is more likely to develop in:  People who havechronic diseases, such as chronic  obstructive pulmonary disease (COPD), asthma, congestive heart failure, cystic fibrosis, diabetes, or kidney disease.  People who haveearly-stage or late-stage HIV.  People who havesickle cell disease.  People who havehad their spleen removed (splenectomy).  People who havepoor Administratordental hygiene.  People who havemedical conditions that increase the risk of breathing in (aspirating) secretions their own mouth and nose.   People who havea weakened immune system (immunocompromised).  People who smoke.  People whotravel to areas where pneumonia-causing germs commonly exist.  People whoare around animal habitats or animals that have pneumonia-causing germs, including birds, bats, rabbits, cats, and farm animals. SYMPTOMS Symptoms of this condition include:  Adry cough.  A wet (productive) cough.  Fever.  Sweating.  Chest pain, especially when breathing deeply or coughing.  Rapid breathing or difficulty breathing.  Shortness of breath.  Shaking chills.  Fatigue.  Muscle aches. DIAGNOSIS Your health care provider will take a medical history and perform a physical exam. You may also have other tests, including:  Imaging studies of your chest, including X-rays.  Tests to check your blood oxygen level and other blood gases.  Other tests on blood, mucus (sputum), fluid around your lungs (pleural fluid), and urine. If your pneumonia is severe, other tests may be done to identify the specific cause of your illness. TREATMENT The type of treatment that you receive depends on many factors, such as the cause of your pneumonia, the medicines you take, and other medical conditions that you have. For most adults, treatment and recovery from pneumonia may occur at home. In some cases, treatment must happen in a hospital. Treatment may include:  Antibiotic medicines,  if the pneumonia was caused by bacteria.  Antiviral medicines, if the pneumonia was caused by a  virus.  Medicines that are given by mouth or through an IV tube.  Oxygen.  Respiratory therapy. Although rare, treating severe pneumonia may include:  Mechanical ventilation. This is done if you are not breathing well on your own and you cannot maintain a safe blood oxygen level.  Thoracentesis. This procedureremoves fluid around one lung or both lungs to help you breathe better. HOME CARE INSTRUCTIONS  Take over-the-counter and prescription medicines only as told by your health care provider.  Only takecough medicine if you are losing sleep. Understand that cough medicine can prevent your body's natural ability to remove mucus from your lungs.  If you were prescribed an antibiotic medicine, take it as told by your health care provider. Do not stop taking the antibiotic even if you start to feel better.  Sleep in a semi-upright position at night. Try sleeping in a reclining chair, or place a few pillows under your head.  Do not use tobacco products, including cigarettes, chewing tobacco, and e-cigarettes. If you need help quitting, ask your health care provider.  Drink enough water to keep your urine clear or pale yellow. This will help to thin out mucus secretions in your lungs. PREVENTION There are ways that you can decrease your risk of developing community-acquired pneumonia. Consider getting a pneumococcal vaccine if:  You are older than 57 years of age.  You are older than 57 years of age and are undergoing cancer treatment, have chronic lung disease, or have other medical conditions that affect your immune system. Ask your health care provider if this applies to you. There are different types and schedules of pneumococcal vaccines. Ask your health care provider which vaccination option is best for you. You may also prevent community-acquired pneumonia if you take these actions:  Get an influenza vaccine every year. Ask your health care provider which type of influenza  vaccine is best for you.  Go to the dentist on a regular basis.  Wash your hands often. Use hand sanitizer if soap and water are not available. SEEK MEDICAL CARE IF:  You have a fever.  You are losing sleep because you cannot control your cough with cough medicine. SEEK IMMEDIATE MEDICAL CARE IF:  You have worsening shortness of breath.  You have increased chest pain.  Your sickness becomes worse, especially if you are an older adult or have a weakened immune system.  You cough up blood.   This information is not intended to replace advice given to you by your health care provider. Make sure you discuss any questions you have with your health care provider.   Document Released: 06/30/2005 Document Revised: 03/21/2015 Document Reviewed: 10/25/2014 Elsevier Interactive Patient Education [employer]

## 2016-05-14 IMAGING — CR DG CHEST 2V
2 series · 2 of 2 positions shown · non-contrast
Comparison: No prior.

CLINICAL DATA: Shortness of breath.

EXAM:
CHEST  2 VIEW

[PA]
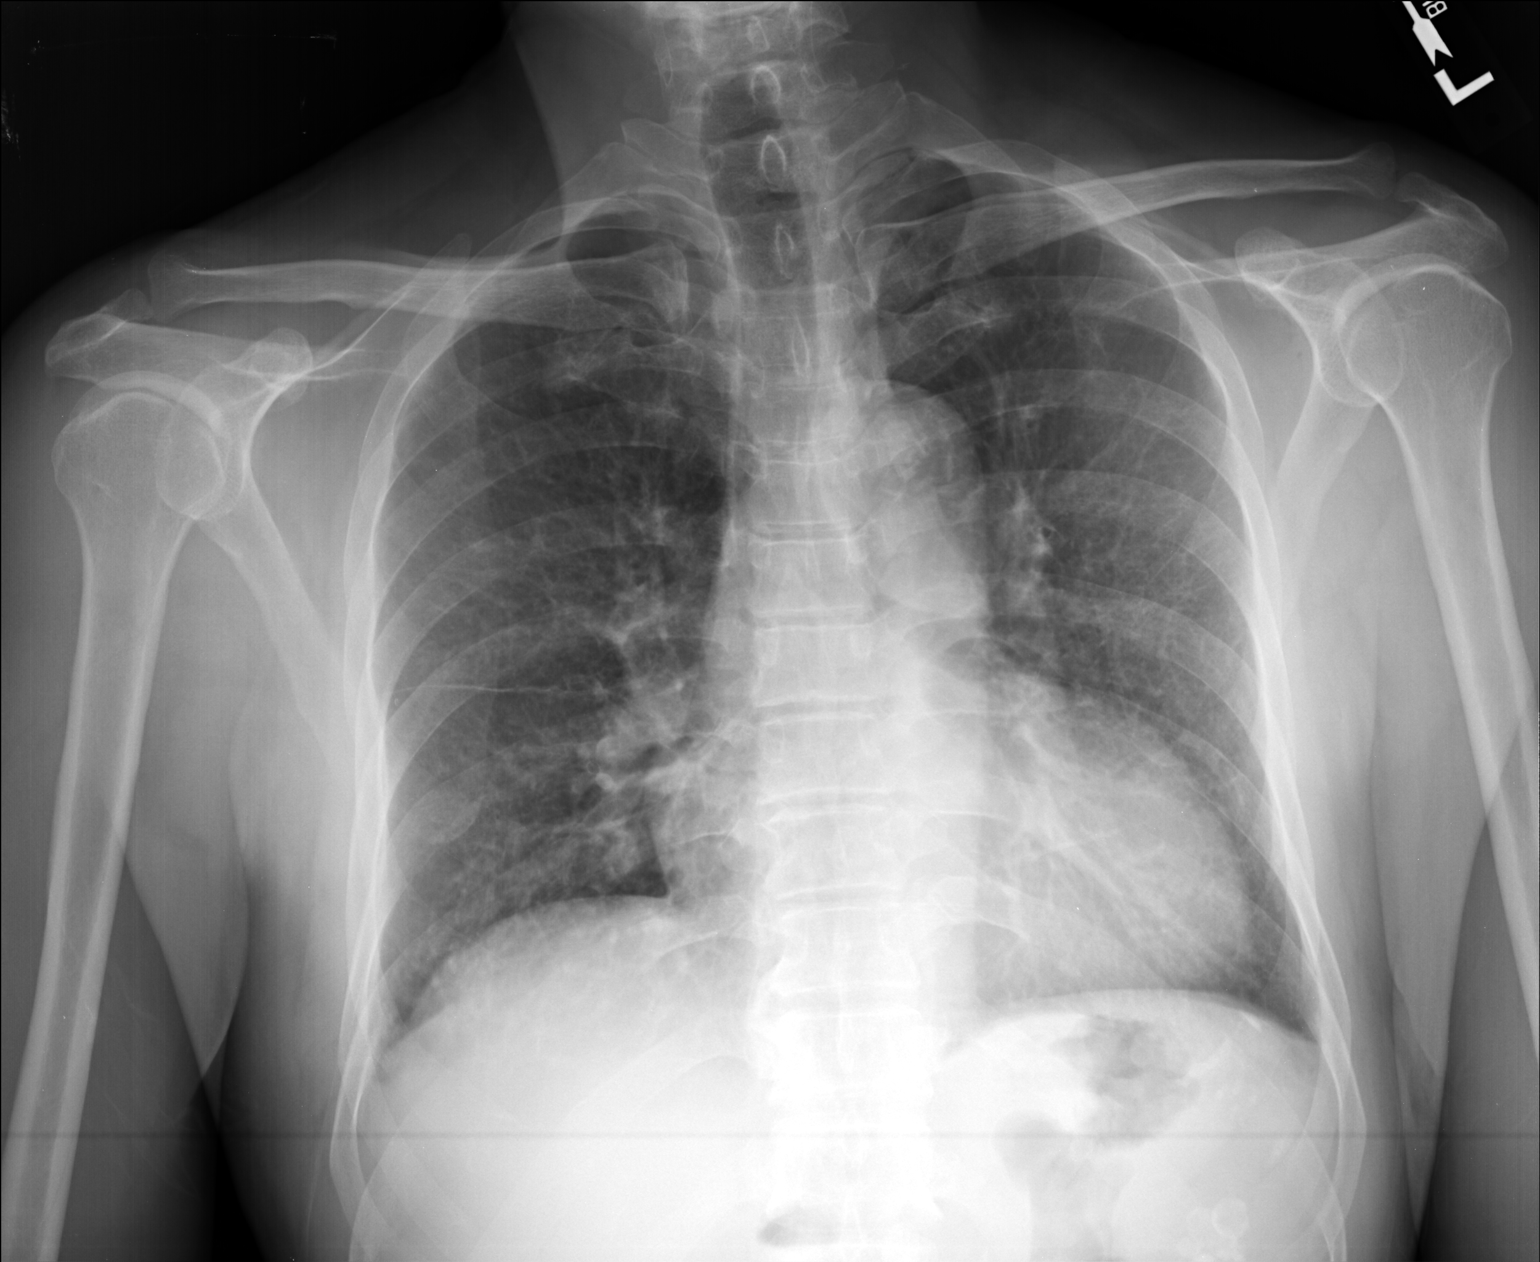

[lateral]
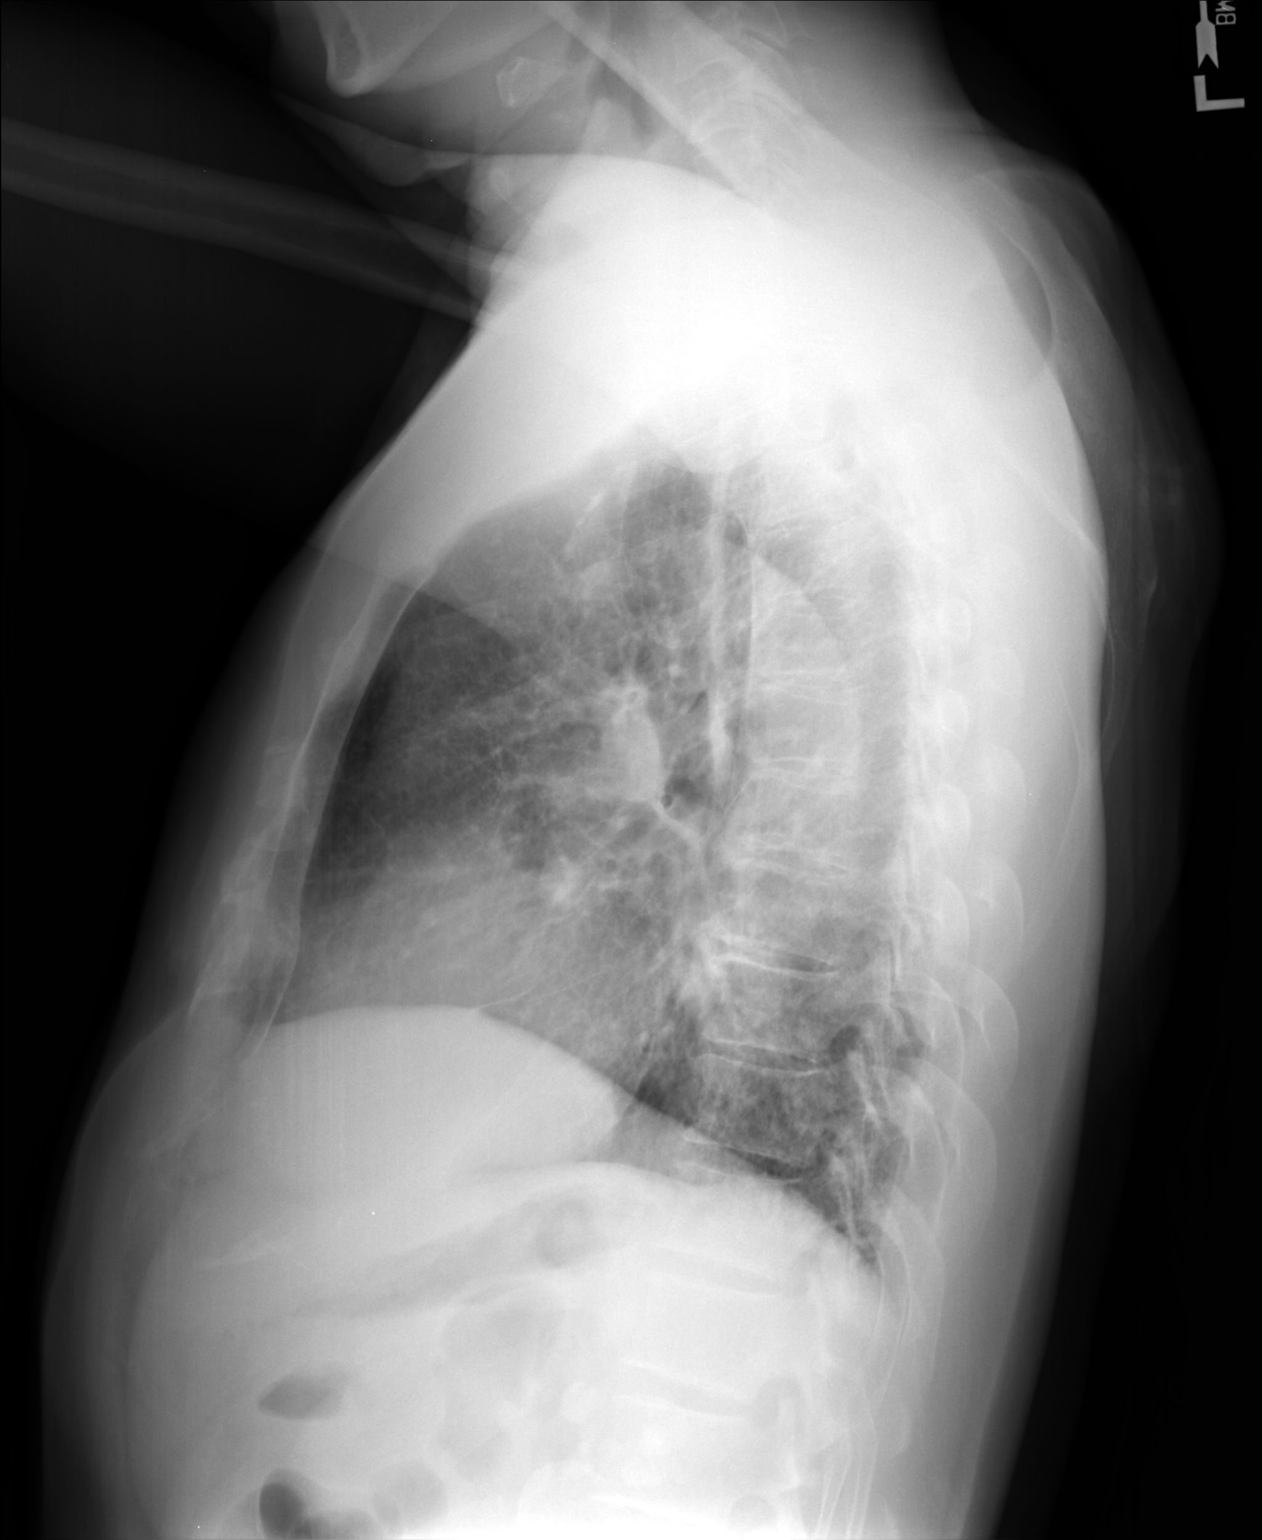

[2 of 2 positions shown; findings below may reference images not displayed]

FINDINGS: Mediastinum and hilar structures normal. Cardiomegaly with normal
pulmonary vascularity. Mild infiltrate left upper lobe and right
lung base cannot be excluded. No pleural effusion or pneumothorax
IMPRESSION: 1. Mild infiltrate left upper lobe and right lung base cannot be
excluded.

2.  Mild cardiomegaly.

## 2019-08-09 ENCOUNTER — Ambulatory Visit: Payer: Self-pay | Attending: Internal Medicine

## 2019-08-09 DIAGNOSIS — Z20822 Contact with and (suspected) exposure to covid-19: Secondary | ICD-10-CM | POA: Insufficient documentation

## 2019-08-10 LAB — NOVEL CORONAVIRUS, NAA: SARS-CoV-2, NAA: NOT DETECTED

## 2022-07-30 ENCOUNTER — Ambulatory Visit
Admission: EM | Admit: 2022-07-30 | Discharge: 2022-07-30 | Disposition: A | Discharge status: Home / Self Care | Payer: BC Managed Care – PPO | Attending: Physician Assistant | Admitting: Physician Assistant

## 2022-07-30 ENCOUNTER — Ambulatory Visit (INDEPENDENT_AMBULATORY_CARE_PROVIDER_SITE_OTHER): Payer: BC Managed Care – PPO

## 2022-07-30 DIAGNOSIS — J189 Pneumonia, unspecified organism: Secondary | ICD-10-CM

## 2022-07-30 DIAGNOSIS — R051 Acute cough: Secondary | ICD-10-CM

## 2022-07-30 DIAGNOSIS — J209 Acute bronchitis, unspecified: Secondary | ICD-10-CM

## 2022-07-30 DIAGNOSIS — R059 Cough, unspecified: Secondary | ICD-10-CM | POA: Diagnosis not present

## 2022-07-30 MED ORDER — ALBUTEROL SULFATE HFA 108 (90 BASE) MCG/ACT IN AERS
2.0000 | INHALATION_SPRAY | Freq: Once | RESPIRATORY_TRACT | Status: AC
  Administered 2022-07-30: 2 via RESPIRATORY_TRACT

## 2022-07-30 MED ORDER — SPACER/AERO-HOLDING CHAMBERS DEVI: 1.0000 [IU] | Freq: Three times a day (TID) | 0 refills | Status: DC

## 2022-07-30 MED ORDER — HYDROCODONE BIT-HOMATROP MBR 5-1.5 MG/5ML PO SOLN: 5.0000 mL | Freq: Four times a day (QID) | ORAL | 0 refills | Status: DC | PRN

## 2022-07-30 MED ORDER — DOXYCYCLINE HYCLATE 100 MG PO CAPS: 100.0000 mg | ORAL_CAPSULE | Freq: Two times a day (BID) | ORAL | 0 refills | Status: DC

## 2022-07-30 MED ORDER — AEROCHAMBER PLUS FLO-VU MEDIUM MISC: 1.0000 | Freq: Once | Status: DC

## 2022-07-30 NOTE — Discharge Instructions (Addendum)
Advised to use the albuterol inhaler with spacer, 2 puffs every 6 hours on a regular basis to help decrease cough, congestion and wheezing. Advised take the Hycodan cough syrup, 1 to 2 teaspoons every 6-8 hours mainly at night and before bedtime to help control cough and congestion.  (This medicine will make you drowsy so use with caution) Advised take the doxycycline 100 mg every 12 hours to treat the pneumonia.  Advised follow-up PCP or return to urgent care if symptoms fail to improve.

## 2022-07-30 NOTE — ED Triage Notes (Signed)
Pt present coughing with congestion and headache. Symptoms of cough started a few weeks ago and the other symptoms just progressive with coughing. Pt tried otc medication with no relief;

## 2022-07-30 NOTE — ED Provider Notes (Addendum)
[facility]    CSN: [id number] Arrival date & time: 07/30/22  1034      History   Chief Complaint Chief Complaint  Patient presents with   Cough    HPI George Hodges is a 62 y.o. male.   62 year old male presents with cough and chest congestion.  Patient indicates that 2 weeks ago he started with upper respiratory congestion, head congestion with rhinitis which is mainly clear.  He indicates that he did have some chest congestion at that time which was mild but the chest congestion and cough have been constant and progressive over the past 2 weeks.  He indicates that he has been having coughing fits and exacerbations that tend to keep him up at night on a regular basis over the past 2 weeks.  He relates that cough he has thick green production on a regular basis.  He does have occasional wheezing shortness of breath with the cough.  He indicates he has used several different OTC medications but these have failed to control his coughing spasms.  He denies fever, chills, nausea or vomiting.  Patient is tolerating fluids well and is without nausea or vomiting.  He denies having fever, chills, body aches or pain.   Cough Associated symptoms: shortness of breath and wheezing     History reviewed. No pertinent past medical history.  There are no problems to display for this patient.   Past Surgical History:  Procedure Laterality Date   APPENDECTOMY     TENDON REPAIR Left 03/08/2014   Procedure: Left Wrist Wound Exploration with Tendon and Nerve Repair;  Surgeon: [name], MD;  Location: [facility];  Service: Orthopedics;  Laterality: Left;       Home Medications    Prior to Admission medications   Medication Sig Start Date End Date Taking? Authorizing Provider  doxycycline (VIBRAMYCIN) 100 MG capsule Take 1 capsule (100 mg total) by mouth 2 (two) times daily. 07/30/22  Yes Nyoka Lint, PA-C  HYDROcodone bit-homatropine (HYCODAN) 5-1.5 MG/5ML syrup Take 5 mLs by mouth  every 6 (six) hours as needed for cough. 07/30/22  Yes Nyoka Lint, PA-C  Spacer/Aero-Holding Chambers DEVI 1 Units by Does not apply route in the morning, at noon, and at bedtime. 07/30/22  Yes Nyoka Lint, PA-C  azithromycin [facility]) 250 MG tablet Take 2 pills by mouth on day 1, then 1 pill by mouth per day on days 2 through 5. 09/21/15   Wendie Agreste, MD    Family History History reviewed. No pertinent family history.  Social History Social History   Tobacco Use   Smoking status: Former   Smokeless tobacco: Never  Substance Use Topics   Alcohol use: Yes    Comment: 1-2 cans of beer daily   Drug use: No     Allergies   Patient has no known allergies.   Review of Systems Review of Systems  Constitutional:  Positive for fatigue.  Respiratory:  Positive for cough, shortness of breath and wheezing.      Physical Exam Triage Vital Signs ED Triage Vitals  Enc Vitals Group     BP 07/30/22 1138 (!) 166/82     Pulse Rate 07/30/22 1138 79     Resp 07/30/22 1138 18     Temp 07/30/22 1138 98.8 F (37.1 C)     Temp Source 07/30/22 1138 Oral     SpO2 07/30/22 1138 95 %     Weight --      Height --  Head Circumference --      Peak Flow --      Pain Score 07/30/22 1137 0     Pain Loc --      Pain Edu? --      Excl. in [place]? --    No data found.  Updated Vital Signs BP (!) 166/82 (BP Location: Left Arm)   Pulse 79   Temp 98.8 F (37.1 C) (Oral)   Resp 18   SpO2 95%   Visual Acuity Right Eye Distance:   Left Eye Distance:   Bilateral Distance:    Right Eye Near:   Left Eye Near:    Bilateral Near:     Physical Exam Constitutional:      Appearance: Normal appearance.  HENT:     Right Ear: Tympanic membrane and ear canal normal.     Left Ear: Tympanic membrane and ear canal normal.     Mouth/Throat:     Mouth: Mucous membranes are moist.     Pharynx: Oropharynx is clear.  Cardiovascular:     Rate and Rhythm: Normal rate and regular rhythm.      Heart sounds: Normal heart sounds.  Pulmonary:     Effort: Pulmonary effort is normal.     Breath sounds: Normal air entry. Examination of the right-middle field reveals rales. Examination of the right-lower field reveals wheezing. Examination of the left-lower field reveals wheezing and rales. Wheezing (mild bilat on expiration) and rales (mild bilat) present. No rhonchi.  Lymphadenopathy:     Cervical: No cervical adenopathy.  Neurological:     Mental Status: He is alert.      UC Treatments / Results  Labs (all labs ordered are listed, but only abnormal results are displayed) Labs Reviewed - No data to display  EKG   Radiology DG Chest 2 View  Result Date: 07/30/2022 CLINICAL DATA:  Worsening cough.  Congestion and headache. EXAM: CHEST - 2 VIEW COMPARISON:  09/21/2015. FINDINGS: Trachea is midline. Heart is enlarged. Interstitial prominence and indistinctness bilaterally. Left lower lobe streaky opacification. No dense airspace consolidation or pleural fluid. IMPRESSION: 1. Interstitial prominence and indistinctness may be due to edema or viral/atypical pneumonia. 2. Left lower lobe subsegmental atelectasis or scarring. Electronically Signed   By: Lorin Picket M.D.   On: 07/30/2022 12:37    Procedures Procedures (including critical care time)  Medications Ordered in UC Medications  albuterol (VENTOLIN HFA) 108 (90 Base) MCG/ACT inhaler 2 puff (2 puffs Inhalation Given 07/30/22 1244)    Initial Impression / Assessment and Plan / UC Course  I have reviewed the triage vital signs and the nursing notes.  Pertinent labs & imaging results that were available during my care of the patient were reviewed by me and considered in my medical decision making (see chart for details).    Plan: 1.  The acute cough will be treated with the following: A.  Hycodan cough syrup, 1 to 2 teaspoons every 6-8 hours needed for cough. 2.  The acute bronchitis will be treated with the  following: A.  Albuterol inhaler with spacer, 2 puffs every 6 hours on a regular basis to help decrease cough, wheezing, shortness of breath. 3.  The pneumonia will be treated with the following: A.  Doxycycline 100 mg every 12 hours until completed. 4.  Patient advised follow-up PCP return to urgent care as needed. Final Clinical Impressions(s) / UC Diagnoses   Final diagnoses:  Acute cough  Acute bronchitis, unspecified organism  Pneumonia of  left lower lobe due to infectious organism     Discharge Instructions      Advised to use the albuterol inhaler with spacer, 2 puffs every 6 hours on a regular basis to help decrease cough, congestion and wheezing. Advised take the Hycodan cough syrup, 1 to 2 teaspoons every 6-8 hours mainly at night and before bedtime to help control cough and congestion.  (This medicine will make you drowsy so use with caution) Advised take the doxycycline 100 mg every 12 hours to treat the pneumonia.  Advised follow-up PCP or return to urgent care if symptoms fail to improve.    ED Prescriptions     Medication Sig Dispense Auth. Provider   HYDROcodone bit-homatropine (HYCODAN) 5-1.5 MG/5ML syrup Take 5 mLs by mouth every 6 (six) hours as needed for cough. 120 mL Ellsworth Lennox, PA-C   doxycycline (VIBRAMYCIN) 100 MG capsule Take 1 capsule (100 mg total) by mouth 2 (two) times daily. 20 capsule Ellsworth Lennox, PA-C   Spacer/Aero-Holding Chambers DEVI 1 Units by Does not apply route in the morning, at noon, and at bedtime. 1 Units Ellsworth Lennox, PA-C      PDMP not reviewed this encounter.   Ellsworth Lennox, PA-C 07/30/22 1251    Ellsworth Lennox, PA-C 07/30/22 1312

## 2022-09-20 ENCOUNTER — Ambulatory Visit (INDEPENDENT_AMBULATORY_CARE_PROVIDER_SITE_OTHER): Payer: 59

## 2022-09-20 ENCOUNTER — Ambulatory Visit
Admission: EM | Admit: 2022-09-20 | Discharge: 2022-09-20 | Disposition: A | Discharge status: Home / Self Care | Payer: 59 | Attending: Internal Medicine | Admitting: Internal Medicine

## 2022-09-20 DIAGNOSIS — M25561 Pain in right knee: Secondary | ICD-10-CM | POA: Diagnosis not present

## 2022-09-20 DIAGNOSIS — M25562 Pain in left knee: Secondary | ICD-10-CM

## 2022-09-20 MED ORDER — PREDNISONE 20 MG PO TABS: 40.0000 mg | ORAL_TABLET | Freq: Every day | ORAL | 0 refills | Status: AC

## 2022-09-20 NOTE — ED Provider Notes (Signed)
[facility]    CSN: [id number] Arrival date & time: 09/20/22  1222      History   Chief Complaint Chief Complaint  Patient presents with   Knee Pain    HPI George Hodges is a 62 y.o. male.   Patient presents with bilateral knee pain.  Son is present.  Daughter is present on telephone who helps provide history given language barrier.  She reports multiple week history of bilateral knee pain that has been worsening. Right knee pain is worse than the left.  She reports that he has been taking several over-the-counter medications including Salonpas patches with no improvement in symptoms.  Reports that he has had knee issues for several years but it has seemed to be worse over the past few weeks.  Denies any obvious injury.  Daughter reports that he works a job where he does a lot of prolonged standing.  Daughter denies history of hypertension.  She reports he does not take any daily medications.  Patient not reporting chest pain, shortness of breath, headache, dizziness, blurred vision, shortness of breath.   Knee Pain   No past medical history on file.  There are no problems to display for this patient.   Past Surgical History:  Procedure Laterality Date   APPENDECTOMY     TENDON REPAIR Left 03/08/2014   Procedure: Left Wrist Wound Exploration with Tendon and Nerve Repair;  Surgeon: [name], MD;  Location: [facility];  Service: Orthopedics;  Laterality: Left;       Home Medications    Prior to Admission medications   Medication Sig Start Date End Date Taking? Authorizing Provider  predniSONE (DELTASONE) 20 MG tablet Take 2 tablets (40 mg total) by mouth daily for 5 days. 09/20/22 09/25/22 Yes Brenya Taulbee, Michele Rockers, FNP  azithromycin (ZITHROMAX) 250 MG tablet Take 2 pills by mouth on day 1, then 1 pill by mouth per day on days 2 through 5. 09/21/15   Wendie Agreste, MD  doxycycline (VIBRAMYCIN) 100 MG capsule Take 1 capsule (100 mg total) by mouth 2 (two) times daily.  07/30/22   Nyoka Lint, PA-C  HYDROcodone bit-homatropine (HYCODAN) 5-1.5 MG/5ML syrup Take 5 mLs by mouth every 6 (six) hours as needed for cough. 07/30/22   Nyoka Lint, PA-C  Spacer/Aero-Holding Chambers DEVI 1 Units by Does not apply route in the morning, at noon, and at bedtime. 07/30/22   Nyoka Lint, PA-C    Family History No family history on file.  Social History Social History   Tobacco Use   Smoking status: Former   Smokeless tobacco: Never  Substance Use Topics   Alcohol use: Yes    Comment: 1-2 cans of beer daily   Drug use: No     Allergies   Patient has no known allergies.   Review of Systems Review of Systems Per HPI  Physical Exam Triage Vital Signs ED Triage Vitals  Enc Vitals Group     BP 09/20/22 1432 (!) 187/91     Pulse Rate 09/20/22 1432 66     Resp 09/20/22 1432 18     Temp 09/20/22 1432 97.6 F (36.4 C)     Temp Source 09/20/22 1432 Oral     SpO2 09/20/22 1432 98 %     Weight --      Height --      Head Circumference --      Peak Flow --      Pain Score 09/20/22 1431 9  Pain Loc --      Pain Edu? --      Excl. in [place]? --    No data found.  Updated Vital Signs BP (!) 173/96 (BP Location: Left Arm)   Pulse 66   Temp 97.6 F (36.4 C) (Oral)   Resp 18   SpO2 98%   Visual Acuity Right Eye Distance:   Left Eye Distance:   Bilateral Distance:    Right Eye Near:   Left Eye Near:    Bilateral Near:     Physical Exam Constitutional:      General: He is not in acute distress.    Appearance: Normal appearance. He is not toxic-appearing or diaphoretic.  HENT:     Head: Normocephalic and atraumatic.  Eyes:     Extraocular Movements: Extraocular movements intact.     Conjunctiva/sclera: Conjunctivae normal.     Pupils: Pupils are equal, round, and reactive to light.  Cardiovascular:     Rate and Rhythm: Normal rate and regular rhythm.     Pulses: Normal pulses.     Heart sounds: Normal heart sounds.  Pulmonary:     Effort:  Pulmonary effort is normal. No respiratory distress.     Breath sounds: Normal breath sounds.  Musculoskeletal:     Comments: Tenderness to palpation throughout entirety of bilateral anterior knees.  It also appears the patient has fluid on the lateral portion of the right knee as well as on the medial portion of the left knee at the anterior portion.  No obvious discoloration.  Neurovascularly intact. Full ROM noted.   Neurological:     General: No focal deficit present.     Mental Status: He is alert and oriented to person, place, and time. Mental status is at baseline.     Cranial Nerves: Cranial nerves 2-12 are intact.     Sensory: Sensation is intact.     Motor: Motor function is intact.     Coordination: Coordination is intact.     Gait: Gait is intact.  Psychiatric:        Mood and Affect: Mood normal.        Behavior: Behavior normal.        Thought Content: Thought content normal.        Judgment: Judgment normal.      UC Treatments / Results  Labs (all labs ordered are listed, but only abnormal results are displayed) Labs Reviewed - No data to display  EKG   Radiology DG Knee Complete 4 Views Left  Result Date: 09/20/2022 CLINICAL DATA:  Knee pain.  No known injury EXAM: LEFT KNEE - COMPLETE 4+ VIEW; RIGHT KNEE - COMPLETE 4+ VIEW COMPARISON:  01/01/2013 FINDINGS: No acute fracture or malalignment of the bilateral knees. Mild tricompartmental osteoarthritis of both knees, which is most pronounced within the patellofemoral compartments. Small bilateral knee joint effusions are nonspecific. 7 mm probable loose body at the posterior joint line of the right knee. No soft tissue swelling. IMPRESSION: 1. Mild tricompartmental osteoarthritis of both knees, most pronounced within the patellofemoral compartments. No acute findings. 2. Small bilateral knee joint effusions are nonspecific. Electronically Signed   By: Davina Poke D.O.   On: 09/20/2022 15:16   DG Knee Complete 4  Views Right  Result Date: 09/20/2022 CLINICAL DATA:  Knee pain.  No known injury EXAM: LEFT KNEE - COMPLETE 4+ VIEW; RIGHT KNEE - COMPLETE 4+ VIEW COMPARISON:  01/01/2013 FINDINGS: No acute fracture or malalignment of the bilateral knees.  Mild tricompartmental osteoarthritis of both knees, which is most pronounced within the patellofemoral compartments. Small bilateral knee joint effusions are nonspecific. 7 mm probable loose body at the posterior joint line of the right knee. No soft tissue swelling. IMPRESSION: 1. Mild tricompartmental osteoarthritis of both knees, most pronounced within the patellofemoral compartments. No acute findings. 2. Small bilateral knee joint effusions are nonspecific. Electronically Signed   By: Davina Poke D.O.   On: 09/20/2022 15:16    Procedures Procedures (including critical care time)  Medications Ordered in UC Medications - No data to display  Initial Impression / Assessment and Plan / UC Course  I have reviewed the triage vital signs and the nursing notes.  Pertinent labs & imaging results that were available during my care of the patient were reviewed by me and considered in my medical decision making (see chart for details).     X-rays of the knees shows osteoarthritis and small joint effusions.  Suspect this cause of the patient's pain.  Also has possible foreign body noted on the right knee which is most likely due to inflammation and is a bony foreign body.  Do not think emergent evaluation is necessary at this time.  Will treat with prednisone to decrease inflammation.  Daughter reports he has taken this before and tolerated well.  Also advised supportive care including a knee brace, ice application.  Advised alternating knee brace.  Advised following up with provided contact information for orthopedist for further evaluation and management.  Blood pressure is elevated but suspect this is due to pain and daughter is agreeable.  Patient is asymptomatic  regarding blood pressure so do not think that emergent evaluation is necessary.  Daughter reports blood pressure is typically [id number] to [id number] systolic.  Therefore, advised to monitor blood pressure very closely at home and follow-up with family doctor or urgent care if remains elevated.  Patient and daughter verbalized understanding and were agreeable with plan.  They declined interpreter and wished for daughter to interpret. Final Clinical Impressions(s) / UC Diagnoses   Final diagnoses:  Acute pain of both knees     Discharge Instructions      I have prescribed prednisone to help alleviate inflammation associated with arthritis of the knees.  Follow-up with orthopedist on Monday to schedule appointment for further evaluation and management.  Monitor blood pressure very closely at home and follow-up if it remains elevated.     ED Prescriptions     Medication Sig Dispense Auth. Provider   predniSONE (DELTASONE) 20 MG tablet Take 2 tablets (40 mg total) by mouth daily for 5 days. 10 tablet Teodora Medici, [place]      PDMP not reviewed this encounter.   Teodora Medici, [place] 09/20/22 [phone]

## 2022-09-20 NOTE — ED Triage Notes (Signed)
Patient presents to UC accompanied by son for bilateral knee pain x 1 week. Taking tylenol.   Denies injury.

## 2022-09-20 NOTE — Discharge Instructions (Signed)
I have prescribed prednisone to help alleviate inflammation associated with arthritis of the knees.  Follow-up with orthopedist on Monday to schedule appointment for further evaluation and management.  Monitor blood pressure very closely at home and follow-up if it remains elevated.

## 2022-10-08 ENCOUNTER — Ambulatory Visit (INDEPENDENT_AMBULATORY_CARE_PROVIDER_SITE_OTHER): Payer: 59 | Admitting: Orthopaedic Surgery

## 2022-10-08 DIAGNOSIS — M25561 Pain in right knee: Secondary | ICD-10-CM | POA: Diagnosis not present

## 2022-10-08 DIAGNOSIS — M25562 Pain in left knee: Secondary | ICD-10-CM | POA: Diagnosis not present

## 2022-10-08 DIAGNOSIS — M174 Other bilateral secondary osteoarthritis of knee: Secondary | ICD-10-CM

## 2022-10-08 MED ORDER — LIDOCAINE HCL 1 % IJ SOLN
4.0000 mL | INTRAMUSCULAR | Status: AC | PRN
  Administered 2022-10-08: 4 mL

## 2022-10-08 MED ORDER — TRIAMCINOLONE ACETONIDE 40 MG/ML IJ SUSP
80.0000 mg | INTRAMUSCULAR | Status: AC | PRN
  Administered 2022-10-08: 80 mg via INTRA_ARTICULAR

## 2022-10-08 NOTE — Progress Notes (Signed)
Chief Complaint: Bilateral knee pain     History of Present Illness:    George Hodges is a 62 y.o. male presents today with right worse than left knee pain.  He is overall very active and does work at [employer].  He states that he does walk and stand for the majority of the day.  He is here today with his daughter who is a Marine scientist on 3 E.  He is also having some pain radiating down the aspect of the right leg.  The knee pain is worse with swelling with any type of longer walking.    Surgical History:   none  PMH/PSH/Family History/Social History/Meds/Allergies:   No past medical history on file. Past Surgical History:  Procedure Laterality Date  . APPENDECTOMY    . TENDON REPAIR Left 03/08/2014   Procedure: Left Wrist Wound Exploration with Tendon and Nerve Repair;  Surgeon: [name], MD;  Location: [facility];  Service: Orthopedics;  Laterality: Left;   Social History   Socioeconomic History  . Marital status: Married    Spouse name: Not on file  . Number of children: Not on file  . Years of education: Not on file  . Highest education level: Not on file  Occupational History  . Not on file  Tobacco Use  . Smoking status: Former  . Smokeless tobacco: Never  Substance and Sexual Activity  . Alcohol use: Yes    Comment: 1-2 cans of beer daily  . Drug use: No  . Sexual activity: Not on file  Other Topics Concern  . Not on file  Social History Narrative  . Not on file   Social Determinants of Health   Financial Resource Strain: Not on file  Food Insecurity: Not on file  Transportation Needs: Not on file  Physical Activity: Not on file  Stress: Not on file  Social Connections: Not on file   No family history on file. No Known Allergies Current Outpatient Medications  Medication Sig Dispense Refill  . azithromycin (ZITHROMAX) 250 MG tablet Take 2 pills by mouth on day 1, then 1 pill by mouth per day on days  2 through 5. 6 tablet 0  . doxycycline (VIBRAMYCIN) 100 MG capsule Take 1 capsule (100 mg total) by mouth 2 (two) times daily. 20 capsule 0  . HYDROcodone bit-homatropine (HYCODAN) 5-1.5 MG/5ML syrup Take 5 mLs by mouth every 6 (six) hours as needed for cough. 120 mL 0  . Spacer/Aero-Holding Chambers DEVI 1 Units by Does not apply route in the morning, at noon, and at bedtime. 1 Units 0   No current facility-administered medications for this visit.   No results found.  Review of Systems:   A ROS was performed including pertinent positives and negatives as documented in the HPI.  Physical Exam :   Constitutional: NAD and appears stated age Neurological: Alert and oriented Psych: Appropriate affect and cooperative There were no vitals taken for this visit.   Comprehensive Musculoskeletal Exam:    With bilateral knee pain with medial lateral joint line.  Negative McMurray.  There is trace effusion about the right knee.  Range of motion is from 0 to 130 degrees.  Imaging:   Xray (4 views right knee, 4 views left knee): Early degenerative findings particular about the  patellofemoral joint    I personally reviewed and interpreted the radiographs.   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  X-rays of the recommend an ultrasound-guided direct week.  Will plan to proceed with this and I will see him back as needed  Plan :    - Bilateral knee ultrasound-guided injection performed after verbal consent obtained    Procedure Note  Patient: George Hodges             Date of Birth: 24-Nov-1960           MRN: [id number]             Visit Date: 10/08/2022  Procedures: Visit Diagnoses: No diagnosis found.  Large Joint Inj: R knee on 10/08/2022 4:34 PM Indications: pain Details: 22 G 1.5 in needle, ultrasound-guided anterior approach  Arthrogram: No  Medications: 4 mL lidocaine 1 %; 80 mg triamcinolone acetonide 40 MG/ML Outcome:  tolerated well, no immediate complications Procedure, treatment alternatives, risks and benefits explained, specific risks discussed. Consent was given by the patient. Immediately prior to procedure a time out was called to verify the correct patient, procedure, equipment, support staff and site/side marked as required. Patient was prepped and draped in the usual sterile fashion.    Large Joint Inj: L knee on 10/08/2022 4:34 PM Indications: pain Details: 22 G 1.5 in needle, ultrasound-guided anterior approach  Arthrogram: No  Medications: 4 mL lidocaine 1 %; 80 mg triamcinolone acetonide 40 MG/ML Outcome: tolerated well, no immediate complications Procedure, treatment alternatives, risks and benefits explained, specific risks discussed. Consent was given by the patient. Immediately prior to procedure a time out was called to verify the correct patient, procedure, equipment, support staff and site/side marked as required. Patient was prepped and draped in the usual sterile fashion.          I personally saw and evaluated the patient, and participated in the management and treatment plan.  Vanetta Mulders, MD Attending Physician, Orthopedic Surgery  This document was dictated using Dragon voice recognition software. A reasonable attempt at proof reading has been made to minimize errors.

## 2022-10-22 NOTE — Progress Notes (Signed)
Subjective:    George Hodges - 62 y.o. male MRN [id number]  Date of birth: 1961-05-27  HPI  George Hodges is to establish care. He is accompanied by his daughter George Hodges.   Current issues and/or concerns: Home blood pressures 160's/60's-70's. Recently he began monitoring his salt intake. He denies red flag symptoms such as but not limited to chest pain, shortness of breath, worst headache of life, nausea/vomiting. Initially patient and patient's daughter thought his increased blood pressure was due to knee pain. Since then patient is established with Orthopedics for management of the same with improvement/stabilization of knee pain. However, his blood pressure is consistently high. Reports he recently had a steroid injection of the knee from Orthopedics and another course of steroids at the local Urgent Care. No further issues/concerns for discussion today.    ROS per HPI     Health Maintenance:  Health Maintenance Due  Topic Date Due   COVID-19 Vaccine (1) Never done   HIV Screening  Never done   Hepatitis C Screening  Never done   COLONOSCOPY (Pts 45-58yrs Insurance coverage will need to be confirmed)  Never done   Zoster Vaccines- Shingrix (1 of 2) Never done     Past Medical History: There are no problems to display for this patient.   Social History   reports that he has quit smoking. He has never used smokeless tobacco. He reports current alcohol use. He reports that he does not use drugs.   Family History  family history is not on file.   Medications: reviewed and updated   Objective:   Physical Exam BP (!) 173/85 (BP Location: Right Arm, Patient Position: Sitting, Cuff Size: Normal)   Pulse 69   Temp 98.6 F (37 C)   Resp 14   Ht  (1.626 m)   Wt 134 lb 3.2 oz (60.9 kg)   SpO2 98%   BMI 23.04 kg/m   Physical Exam HENT:     Head: Normocephalic and atraumatic.  Eyes:     Extraocular Movements: Extraocular movements intact.     Conjunctiva/sclera:  Conjunctivae normal.     Pupils: Pupils are equal, round, and reactive to light.  Cardiovascular:     Rate and Rhythm: Normal rate and regular rhythm.     Pulses: Normal pulses.     Heart sounds: Normal heart sounds.  Pulmonary:     Effort: Pulmonary effort is normal.     Breath sounds: Normal breath sounds.  Musculoskeletal:     Cervical back: Normal range of motion and neck supple.  Neurological:     General: No focal deficit present.     Mental Status: He is alert and oriented to person, place, and time.  Psychiatric:        Mood and Affect: Mood normal.        Behavior: Behavior normal.        Assessment & Plan:  1. Encounter to establish care - Patient presents today to establish care. During the interim follow-up with primary provider as scheduled.  - Return for annual physical examination, labs, and health maintenance. Arrive fasting meaning having no food for at least 8 hours prior to appointment. You may have only water or black coffee. Please take scheduled medications as normal.  2. Primary hypertension - New onset.  - Blood pressure not at goal during today's visit. Patient asymptomatic without chest pressure, chest pain, palpitations, shortness of breath, worst headache of life, and any additional red flag symptoms. -  Valsartan as prescribed. Counseled on medication adherence/adverse effects. - Routine screening.  - Counseled on blood pressure goal of less than 130/80, low-sodium, DASH diet, medication compliance, and 150 minutes of moderate intensity exercise per week as tolerated. Counseled on medication adherence and adverse effects. - Follow-up with primary provider in 1 to 2 weeks or sooner if needed for blood pressure check.  - CMP14+EGFR - valsartan (DIOVAN) 40 MG tablet; Take 1 tablet (40 mg total) by mouth daily.  Dispense: 30 tablet; Refill: 1  3. Language barrier - Patient accompanied by his daughter George Hodges who serves as interpreter and part-historian.      Patient was given clear instructions to go to Emergency Department or return to medical center if symptoms don't improve, worsen, or new problems develop.The patient verbalized understanding.  I discussed the assessment and treatment plan with the patient. The patient was provided an opportunity to ask questions and all were answered. The patient agreed with the plan and demonstrated an understanding of the instructions.   The patient was advised to call back or seek an in-person evaluation if the symptoms worsen or if the condition fails to improve as anticipated.    Ricky Stabs, NP 10/31/2022, 10:24 AM Primary Care at [facility]

## 2022-10-31 ENCOUNTER — Ambulatory Visit (INDEPENDENT_AMBULATORY_CARE_PROVIDER_SITE_OTHER): Payer: 59 | Admitting: Family

## 2022-10-31 ENCOUNTER — Encounter: Payer: Self-pay | Admitting: Family

## 2022-10-31 VITALS — BP 173/85 | HR 69 | Temp 98.6°F | Resp 14 | Ht 64.0 in | Wt 134.2 lb

## 2022-10-31 DIAGNOSIS — Z7689 Persons encountering health services in other specified circumstances: Secondary | ICD-10-CM | POA: Diagnosis not present

## 2022-10-31 DIAGNOSIS — Z603 Acculturation difficulty: Secondary | ICD-10-CM

## 2022-10-31 DIAGNOSIS — I10 Essential (primary) hypertension: Secondary | ICD-10-CM | POA: Diagnosis not present

## 2022-10-31 DIAGNOSIS — Z758 Other problems related to medical facilities and other health care: Secondary | ICD-10-CM | POA: Diagnosis not present

## 2022-10-31 MED ORDER — VALSARTAN 40 MG PO TABS: 40.0000 mg | ORAL_TABLET | Freq: Every day | ORAL | 1 refills | Status: DC

## 2022-10-31 NOTE — Progress Notes (Signed)
Pt is here to est care  Issues with elevated BP X4 weeks   Right knee pain for X4 weeks No known injury  Pt saw ortho for same and was given steroid injections

## 2022-10-31 NOTE — Patient Instructions (Signed)
Thank you for choosing Primary Care at [facility] for your medical home!    George Hodges was seen by Rema Fendt, NP today.   Krayton Patchin's primary care provider is Rema Fendt, NP.   For the best care possible,  you should try to see Ricky Stabs, NP whenever you come to office.   We look forward to seeing you again soon!  If you have any questions about your visit today,  please call us at [phone]  Or feel free to reach your provider via MyChart.   Keeping you healthy   Get these tests Blood pressure- Have your blood pressure checked once a year by your healthcare provider.  Normal blood pressure is 120/80. Weight- Have your body mass index (BMI) calculated to screen for obesity.  BMI is a measure of body fat based on height and weight. You can also calculate your own BMI at https://www.west-esparza.com/. Cholesterol- Have your cholesterol checked regularly starting at age 15, sooner may be necessary if you have diabetes, high blood pressure, if a family member developed heart diseases at an early age or if you smoke.  Chlamydia, HIV, and other sexual transmitted disease- Get screened each year until the age of 98 then within three months of each new sexual partner. Diabetes- Have your blood sugar checked regularly if you have high blood pressure, high cholesterol, a family history of diabetes or if you are overweight.   Get these vaccines Flu shot- Every fall. Tetanus shot- Every 10 years. Menactra- Single dose; prevents meningitis.   Take these steps Don't smoke- If you do smoke, ask your healthcare provider about quitting. For tips on how to quit, go to www.smokefree.gov or call 1-800-QUIT-NOW. Be physically active- Exercise 5 days a week for at least 30 minutes.  If you are not already physically active start slow and gradually work up to 30 minutes of moderate physical activity.  Examples of moderate activity include walking briskly, mowing the yard, dancing, swimming  bicycling, etc. Eat a healthy diet- Eat a variety of healthy foods such as fruits, vegetables, low fat milk, low fat cheese, yogurt, lean meats, poultry, fish, beans, tofu, etc.  For more information on healthy eating, go to www.thenutritionsource.org Drink alcohol in moderation- Limit alcohol intake two drinks or less a day.  Never drink and drive. Dentist- Brush and floss teeth twice daily; visit your dentis twice a year. Depression-Your emotional health is as important as your physical health.  If you're feeling down, losing interest in things you normally enjoy please talk with your healthcare provider. Gun Safety- If you keep a gun in your home, keep it unloaded and with the safety lock on.  Bullets should be stored separately. Helmet use- Always wear a helmet when riding a motorcycle, bicycle, rollerblading or skateboarding. Safe sex- If you may be exposed to a sexually transmitted infection, use a condom Seat belts- Seat bels can save your life; always wear one. Smoke/Carbon Monoxide detectors- These detectors need to be installed on the appropriate level of your home.  Replace batteries at least once a year. Skin Cancer- When out in the sun, cover up and use sunscreen SPF 15 or higher. Violence- If anyone is threatening or hurting you, please tell your healthcare provider.

## 2022-11-01 LAB — CMP14+EGFR
ALT: 82 IU/L — ABNORMAL HIGH (ref 0–44)
AST: 63 IU/L — ABNORMAL HIGH (ref 0–40)
Albumin/Globulin Ratio: 1.3 (ref 1.2–2.2)
Albumin: 3.6 g/dL — ABNORMAL LOW (ref 3.9–4.9)
Alkaline Phosphatase: 58 IU/L (ref 44–121)
BUN/Creatinine Ratio: 19 (ref 10–24)
BUN: 15 mg/dL (ref 8–27)
Bilirubin Total: 0.3 mg/dL (ref 0.0–1.2)
CO2: 25 mmol/L (ref 20–29)
Calcium: 9 mg/dL (ref 8.6–10.2)
Chloride: 105 mmol/L (ref 96–106)
Creatinine, Ser: 0.77 mg/dL (ref 0.76–1.27)
Globulin, Total: 2.7 g/dL (ref 1.5–4.5)
Glucose: 87 mg/dL (ref 70–99)
Potassium: 4.1 mmol/L (ref 3.5–5.2)
Sodium: 141 mmol/L (ref 134–144)
Total Protein: 6.3 g/dL (ref 6.0–8.5)
eGFR: 101 mL/min/{1.73_m2} (ref >59)

## 2022-11-06 ENCOUNTER — Telehealth: Payer: Self-pay

## 2022-11-06 NOTE — Telephone Encounter (Signed)
Pt was called and vm was left, Information has been sent to nurse pool.   

## 2022-11-06 NOTE — Telephone Encounter (Signed)
-----   Message from Guy Franco, RN sent at 11/04/2022 12:10 PM EDT -----  ----- Message ----- From: Rema Fendt, NP Sent: 11/03/2022   7:55 AM EDT To: Marcelle Overlie  - Kidney function normal.  - Liver function higher than normal. Please call our office to schedule an appointment to recheck in 4 to 6 weeks.  - All other values are normal, stable or within acceptable limits.

## 2022-11-07 ENCOUNTER — Ambulatory Visit: Payer: Self-pay | Admitting: Family

## 2022-11-14 NOTE — Progress Notes (Unsigned)
Patient ID: George Hodges, male    DOB: 10-29-1960  MRN: [id number]  CC: Blood Pressure Check   Subjective: George Hodges is a 62 y.o. male who presents for blood pressure check.  His concerns today include:  HTN - Valsartan   There are no problems to display for this patient.    Current Outpatient Medications on File Prior to Visit  Medication Sig Dispense Refill   loratadine (CLARITIN) 10 MG tablet Take 10 mg by mouth daily.     valsartan (DIOVAN) 40 MG tablet Take 1 tablet (40 mg total) by mouth daily. 30 tablet 1   No current facility-administered medications on file prior to visit.    No Known Allergies  Social History   Socioeconomic History   Marital status: Married    Spouse name: Not on file   Number of children: Not on file   Years of education: Not on file   Highest education level: Not on file  Occupational History   Not on file  Tobacco Use   Smoking status: Former   Smokeless tobacco: Never  Substance and Sexual Activity   Alcohol use: Yes    Comment: 1-2 cans of beer daily   Drug use: No   Sexual activity: Not on file  Other Topics Concern   Not on file  Social History Narrative   Not on file   Social Determinants of Health   Financial Resource Strain: Not on file  Food Insecurity: Not on file  Transportation Needs: Not on file  Physical Activity: Not on file  Stress: Not on file  Social Connections: Not on file  Intimate Partner Violence: Not on file    No family history on file.  Past Surgical History:  Procedure Laterality Date   APPENDECTOMY     TENDON REPAIR Left 03/08/2014   Procedure: Left Wrist Wound Exploration with Tendon and Nerve Repair;  Surgeon: Sharma Covert, MD;  Location: [facility] OR;  Service: Orthopedics;  Laterality: Left;    ROS: Review of Systems Negative except as stated above  PHYSICAL EXAM: There were no vitals taken for this visit.  Physical Exam  {male adult master:310786} {male adult master:310785}      Latest Ref Rng & Units 10/31/2022   12:00 AM 03/08/2014    1:09 PM 04/07/2010   12:01 PM  CMP  Glucose 70 - 99 mg/dL 87  88  147   BUN 8 - 27 mg/dL 15  16  9    Creatinine 0.76 - 1.27 mg/dL 8.29  5.62  1.30   Sodium 134 - 144 mmol/L 141  140  137   Potassium 3.5 - 5.2 mmol/L 4.1  4.1  3.7   Chloride 96 - 106 mmol/L 105  102  103   CO2 20 - 29 mmol/L 25  24  26    Calcium 8.6 - 10.2 mg/dL 9.0  9.5  9.0   Total Protein 6.0 - 8.5 g/dL 6.3     Total Bilirubin 0.0 - 1.2 mg/dL 0.3     Alkaline Phos 44 - 121 IU/L 58     AST 0 - 40 IU/L 63     ALT 0 - 44 IU/L 82      Lipid Panel  No results found for: "CHOL", "TRIG", "HDL", "CHOLHDL", "VLDL", "LDLCALC", "LDLDIRECT"  CBC    Component Value Date/Time   WBC 6.4 09/21/2015 1631   WBC 7.8 03/08/2014 1309   RBC 5.07 09/21/2015 1631   RBC 5.24 03/08/2014 1309  HGB 14.0 (A) 09/21/2015 1631   HGB 14.4 03/08/2014 1309   HCT 41.8 (A) 09/21/2015 1631   HCT 43.8 03/08/2014 1309   PLT 197 03/08/2014 1309   MCV 82.5 09/21/2015 1631   MCH 27.7 09/21/2015 1631   MCH 27.5 03/08/2014 1309   MCHC 33.6 09/21/2015 1631   MCHC 32.9 03/08/2014 1309   RDW 13.0 03/08/2014 1309   LYMPHSABS 2.0 03/08/2014 1309   MONOABS 0.8 03/08/2014 1309   EOSABS 0.2 03/08/2014 1309   BASOSABS 0.0 03/08/2014 1309    ASSESSMENT AND PLAN:  There are no diagnoses linked to this encounter.   Patient was given the opportunity to ask questions.  Patient verbalized understanding of the plan and was able to repeat key elements of the plan. Patient was given clear instructions to go to Emergency Department or return to medical center if symptoms don't improve, worsen, or new problems develop.The patient verbalized understanding.   No orders of the defined types were placed in this encounter.    Requested Prescriptions    No prescriptions requested or ordered in this encounter    No follow-ups on file.  Rema Fendt, NP

## 2022-11-18 ENCOUNTER — Ambulatory Visit: Payer: 59 | Admitting: Family

## 2022-11-18 VITALS — BP 152/89 | HR 72 | Temp 98.3°F | Resp 14 | Wt 126.0 lb

## 2022-11-18 DIAGNOSIS — I10 Essential (primary) hypertension: Secondary | ICD-10-CM | POA: Diagnosis not present

## 2022-11-18 MED ORDER — VALSARTAN 80 MG PO TABS: 80.0000 mg | ORAL_TABLET | Freq: Every day | ORAL | 1 refills | Status: DC

## 2022-11-18 NOTE — Progress Notes (Signed)
F/u HTN Taking valsartan daily. Feels fatigue at times when he doesn't take with food.   At home, last week, BP reading was 160-170.  Taken by daughter.

## 2022-11-27 NOTE — Progress Notes (Signed)
Patient ID: George Hodges, male    DOB: 1961-02-02  MRN: [id number]  CC: Chronic Care Management   Subjective: George Hodges is a 62 y.o. male who presents for chronic care management. He is accompanied by his daughter [name].  His concerns today include:  - Doing well on Valsartan, no issues/concerns. Reports he did not take blood pressure medication today because thought he was supposed to be fasting. Home blood pressures above goal. Reports initially had intermittent lightheadedness with blood pressure medication but since then has resolved.The patient does not complain of red flag symptoms such as but not limited to chest pain, shortness of breath, worst headache of life, nausea/vomiting.  - Established with Orthopedics for management of bilateral knee pain. - No further issues/concerns for discussion today.  There are no problems to display for this patient.    Current Outpatient Medications on File Prior to Visit  Medication Sig Dispense Refill   loratadine (CLARITIN) 10 MG tablet Take 10 mg by mouth daily.     valsartan (DIOVAN) 80 MG tablet Take 1 tablet (80 mg total) by mouth daily. 30 tablet 1   No current facility-administered medications on file prior to visit.    No Known Allergies  Social History   Socioeconomic History   Marital status: Married    Spouse name: Not on file   Number of children: Not on file   Years of education: Not on file   Highest education level: Not on file  Occupational History   Not on file  Tobacco Use   Smoking status: Former   Smokeless tobacco: Never  Substance and Sexual Activity   Alcohol use: Yes    Comment: 1-2 cans of beer daily   Drug use: No   Sexual activity: Not on file  Other Topics Concern   Not on file  Social History Narrative   Not on file   Social Determinants of Health   Financial Resource Strain: Not on file  Food Insecurity: Not on file  Transportation Needs: Not on file  Physical Activity: Not on file  Stress:  Not on file  Social Connections: Not on file  Intimate Partner Violence: Not on file    No family history on file.  Past Surgical History:  Procedure Laterality Date   APPENDECTOMY     TENDON REPAIR Left 03/08/2014   Procedure: Left Wrist Wound Exploration with Tendon and Nerve Repair;  Surgeon: Sharma Covert, MD;  Location: [facility] OR;  Service: Orthopedics;  Laterality: Left;    ROS: Review of Systems Negative except as stated above  PHYSICAL EXAM: BP (!) 158/89 (BP Location: Left Arm, Patient Position: Sitting, Cuff Size: Normal)   Pulse 65   Temp 98.3 F (36.8 C) (Oral)   Resp 16   Ht 5\' 4"  (1.626 m)   Wt 123 lb (55.8 kg)   SpO2 96%   BMI 21.11 kg/m   Physical Exam HENT:     Head: Normocephalic and atraumatic.  Eyes:     Extraocular Movements: Extraocular movements intact.     Conjunctiva/sclera: Conjunctivae normal.     Pupils: Pupils are equal, round, and reactive to light.  Cardiovascular:     Rate and Rhythm: Normal rate and regular rhythm.     Pulses: Normal pulses.     Heart sounds: Normal heart sounds.  Pulmonary:     Effort: Pulmonary effort is normal.     Breath sounds: Normal breath sounds.  Musculoskeletal:     Cervical back:  Normal range of motion and neck supple.  Neurological:     General: No focal deficit present.     Mental Status: He is alert and oriented to person, place, and time.  Psychiatric:        Mood and Affect: Mood normal.        Behavior: Behavior normal.      ASSESSMENT AND PLAN: 1. Primary hypertension - Blood pressure not at goal during today's visit. Patient asymptomatic without chest pressure, chest pain, palpitations, shortness of breath, worst headache of life, and any additional red flag symptoms. - Home blood pressure above goal. - Continue Valsartan as prescribed. No refills needed as of present. - Begin Hydrochlorothiazide as prescribed. Counseled on medication adherence/adverse effects. - Routine screening.  -  Counseled on blood pressure goal of less than 130/80, low-sodium, DASH diet, medication compliance, and 150 minutes of moderate intensity exercise per week as tolerated. Counseled on medication adherence and adverse effects. - Follow-up with primary provider in 2 weeks or sooner if needed for blood pressure check. - CMP14+EGFR - hydrochlorothiazide (HYDRODIURIL) 12.5 MG tablet; Take 1 tablet (12.5 mg total) by mouth daily.  Dispense: 30 tablet; Refill: 1  2. Screening cholesterol level - Routine screening.  - Lipid panel  3. Diabetes mellitus screening - Routine screening.  - Hemoglobin A1c    Patient was given the opportunity to ask questions.  Patient verbalized understanding of the plan and was able to repeat key elements of the plan. Patient was given clear instructions to go to Emergency Department or return to medical center if symptoms don't improve, worsen, or new problems develop.The patient verbalized understanding.   Orders Placed This Encounter  Procedures   CMP14+EGFR   Lipid panel   Hemoglobin A1c     Requested Prescriptions   Signed Prescriptions Disp Refills   hydrochlorothiazide (HYDRODIURIL) 12.5 MG tablet 30 tablet 1    Sig: Take 1 tablet (12.5 mg total) by mouth daily.    Return in about 2 weeks (around 12/16/2022) for Follow-Up or next available bp check.  Rema Fendt, NP

## 2022-12-02 ENCOUNTER — Ambulatory Visit: Payer: 59 | Admitting: Family

## 2022-12-02 ENCOUNTER — Encounter: Payer: Self-pay | Admitting: Family

## 2022-12-02 VITALS — BP 158/89 | HR 65 | Temp 98.3°F | Resp 16 | Ht 64.0 in | Wt 123.0 lb

## 2022-12-02 DIAGNOSIS — Z13220 Encounter for screening for lipoid disorders: Secondary | ICD-10-CM | POA: Diagnosis not present

## 2022-12-02 DIAGNOSIS — Z131 Encounter for screening for diabetes mellitus: Secondary | ICD-10-CM

## 2022-12-02 DIAGNOSIS — I10 Essential (primary) hypertension: Secondary | ICD-10-CM

## 2022-12-02 MED ORDER — HYDROCHLOROTHIAZIDE 12.5 MG PO TABS: 12.5000 mg | ORAL_TABLET | Freq: Every day | ORAL | 1 refills | Status: DC

## 2022-12-02 NOTE — Progress Notes (Signed)
Did not take BP medication today because he wasn't sure if he needed to fast.   BP readings at home have been in the 140's Had an episode of becoming lightheaded but it did not last   Has noticed that his knees have been bothering him again.  9/ 10 discomfort with getting up.

## 2022-12-03 ENCOUNTER — Encounter: Payer: Self-pay | Admitting: Family

## 2022-12-03 ENCOUNTER — Other Ambulatory Visit: Payer: Self-pay | Admitting: Family

## 2022-12-03 DIAGNOSIS — R748 Abnormal levels of other serum enzymes: Secondary | ICD-10-CM

## 2022-12-03 DIAGNOSIS — R7303 Prediabetes: Secondary | ICD-10-CM

## 2022-12-03 HISTORY — DX: Prediabetes: R73.03

## 2022-12-03 LAB — LIPID PANEL
Chol/HDL Ratio: 3.5 ratio (ref 0.0–5.0)
Cholesterol, Total: 173 mg/dL (ref 100–199)
HDL: 49 mg/dL (ref >39)
LDL Chol Calc (NIH): 99 mg/dL (ref 0–99)
Triglycerides: 145 mg/dL (ref 0–149)
VLDL Cholesterol Cal: 25 mg/dL (ref 5–40)

## 2022-12-03 LAB — CMP14+EGFR
ALT: 56 IU/L — ABNORMAL HIGH (ref 0–44)
AST: 59 IU/L — ABNORMAL HIGH (ref 0–40)
Albumin/Globulin Ratio: 1 — ABNORMAL LOW (ref 1.2–2.2)
Albumin: 3.5 g/dL — ABNORMAL LOW (ref 3.9–4.9)
Alkaline Phosphatase: 74 IU/L (ref 44–121)
BUN/Creatinine Ratio: 13 (ref 10–24)
BUN: 11 mg/dL (ref 8–27)
Bilirubin Total: 0.3 mg/dL (ref 0.0–1.2)
CO2: 24 mmol/L (ref 20–29)
Calcium: 9.3 mg/dL (ref 8.6–10.2)
Chloride: 105 mmol/L (ref 96–106)
Creatinine, Ser: 0.86 mg/dL (ref 0.76–1.27)
Globulin, Total: 3.5 g/dL (ref 1.5–4.5)
Glucose: 89 mg/dL (ref 70–99)
Potassium: 4.1 mmol/L (ref 3.5–5.2)
Sodium: 143 mmol/L (ref 134–144)
Total Protein: 7 g/dL (ref 6.0–8.5)
eGFR: 97 mL/min/{1.73_m2} (ref >59)

## 2022-12-03 LAB — HEMOGLOBIN A1C
Est. average glucose Bld gHb Est-mCnc: 120 mg/dL
Hgb A1c MFr Bld: 5.8 % — ABNORMAL HIGH (ref 4.8–5.6)

## 2022-12-05 ENCOUNTER — Telehealth: Payer: Self-pay | Admitting: *Deleted

## 2022-12-05 NOTE — Telephone Encounter (Signed)
Pt daughter George Hodges given lab results per notes of A. Zonia Kief, NP from 12/03/22 on 12/05/22. Pt daughter verbalized understanding and will call back regarding scheduling 6 month f/u. - Kidney function normal. - Cholesterol stable.   The following abnormalities are noted:   - Hemoglobin A1c is consistent with prediabetes. Practice healthy eating habits of fresh fruit and vegetables, lean baked meats such as chicken, fish, and [place]; limit breads, rice, pastas, and desserts; practice regular aerobic exercise (at least 150 minutes a week as tolerated). - Liver function remaining above goal.   All other values are normal, stable or within acceptable limits.   Medication changes / Follow up labs / Other changes or recommendations:   - Please call our office to schedule an appointment to recheck prediabetes in 6 months. - Referral to Gastroenterology for further evaluation/management of liver function. Expect call within 2 weeks with appointment details.

## 2022-12-12 NOTE — Progress Notes (Signed)
Patient ID: George Hodges, male    DOB: [birth year]  MRN: [id number]  CC: Blood Pressure Check  Subjective: George Hodges is a 62 y.o. male who presents for blood pressure check. Patient is accompanied by his daughter [name] (in person) and his daughter [name] (by phone).   His concerns today include:  Since last visit doing well on Valsartan, no issues/concerns. Concerns for "chills" since beginning Hydrochlorothiazide. Dianna reports patient took about 6 doses of Hydrochlorothiazide in total over the course of several days when he began to develop "chills". Reports his blood pressures during that time was 130's systolic and unsure of diastolic. Reports patient quit taking Hydrochlorothiazide almost 1 week ago and symptoms of "chills" resolved. Dianna concerned if Hydrochlorothiazide possibly making patient's blood pressure "too soft". The patient does not complain of red flag symptoms such as but not limited to chest pain, shortness of breath, worst headache of life, nausea/vomiting. No further issues/concerns for discussion today.   Patient Active Problem List   Diagnosis Date Noted   Prediabetes 12/03/2022     Current Outpatient Medications on File Prior to Visit  Medication Sig Dispense Refill   loratadine (CLARITIN) 10 MG tablet Take 10 mg by mouth daily.     No current facility-administered medications on file prior to visit.    No Known Allergies  Social History   Socioeconomic History   Marital status: Married    Spouse name: Not on file   Number of children: Not on file   Years of education: Not on file   Highest education level: Not on file  Occupational History   Not on file  Tobacco Use   Smoking status: Former   Smokeless tobacco: Never  Substance and Sexual Activity   Alcohol use: Yes    Comment: 1-2 cans of beer daily   Drug use: No   Sexual activity: Not on file  Other Topics Concern   Not on file  Social History Narrative   Not on file   Social Determinants  of Health   Financial Resource Strain: Not on file  Food Insecurity: Not on file  Transportation Needs: Not on file  Physical Activity: Not on file  Stress: Not on file  Social Connections: Not on file  Intimate Partner Violence: Not on file    No family history on file.  Past Surgical History:  Procedure Laterality Date   APPENDECTOMY     TENDON REPAIR Left 03/08/2014   Procedure: Left Wrist Wound Exploration with Tendon and Nerve Repair;  Surgeon: Sharma Covert, MD;  Location: [facility] OR;  Service: Orthopedics;  Laterality: Left;    ROS: Review of Systems Negative except as stated above  PHYSICAL EXAM: BP 102/64   Pulse 75   Temp 97.7 F (36.5 C) (Oral)   Resp 16   Wt 121 lb 9.6 oz (55.2 kg)   SpO2 98%   BMI 20.87 kg/m   Physical Exam HENT:     Head: Normocephalic and atraumatic.  Eyes:     Extraocular Movements: Extraocular movements intact.     Conjunctiva/sclera: Conjunctivae normal.     Pupils: Pupils are equal, round, and reactive to light.  Cardiovascular:     Rate and Rhythm: Normal rate and regular rhythm.     Pulses: Normal pulses.     Heart sounds: Normal heart sounds.  Pulmonary:     Effort: Pulmonary effort is normal.     Breath sounds: Normal breath sounds.  Musculoskeletal:  Cervical back: Normal range of motion and neck supple.  Neurological:     General: No focal deficit present.     Mental Status: He is alert and oriented to person, place, and time.  Psychiatric:        Mood and Affect: Mood normal.        Behavior: Behavior normal.      ASSESSMENT AND PLAN: 1. Primary hypertension - Hydrochlorothiazide discontinued related to side effects.  - Continue Valsartan as prescribed.  - Routine screening.  - Counseled on blood pressure goal of less than 130/80, low-sodium, DASH diet, medication compliance, and 150 minutes of moderate intensity exercise per week as tolerated. Counseled on medication adherence and adverse effects. - Follow-up  with primary provider in 4 weeks or sooner if needed for blood pressure check. - Basic Metabolic Panel - valsartan (DIOVAN) 80 MG tablet; Take 1 tablet (80 mg total) by mouth daily.  Dispense: 90 tablet; Refill: 0   Patient was given the opportunity to ask questions.  Patient verbalized understanding of the plan and was able to repeat key elements of the plan. Patient was given clear instructions to go to Emergency Department or return to medical center if symptoms don't improve, worsen, or new problems develop.The patient verbalized understanding.   Orders Placed This Encounter  Procedures   Basic Metabolic Panel     Requested Prescriptions   Signed Prescriptions Disp Refills   valsartan (DIOVAN) 80 MG tablet 90 tablet 0    Sig: Take 1 tablet (80 mg total) by mouth daily.    Return in about 4 weeks (around 01/13/2023) for Follow-Up or next available blood pressure check.  Rema Fendt, NP

## 2022-12-16 ENCOUNTER — Encounter: Payer: Self-pay | Admitting: Family

## 2022-12-16 ENCOUNTER — Ambulatory Visit: Payer: 59 | Admitting: Family

## 2022-12-16 VITALS — BP 102/64 | HR 75 | Temp 97.7°F | Resp 16 | Wt 121.6 lb

## 2022-12-16 DIAGNOSIS — I10 Essential (primary) hypertension: Secondary | ICD-10-CM

## 2022-12-16 MED ORDER — VALSARTAN 80 MG PO TABS
80.0000 mg | ORAL_TABLET | Freq: Every day | ORAL | 0 refills | Status: DC
Start: 2022-12-16 — End: 2023-05-07

## 2022-12-16 NOTE — Progress Notes (Signed)
Patient is here for 2wk follow-up BP Patent said medication has being given chills No other concerns today

## 2022-12-17 ENCOUNTER — Encounter: Payer: Self-pay | Admitting: Family

## 2022-12-17 ENCOUNTER — Other Ambulatory Visit: Payer: Self-pay | Admitting: Family

## 2022-12-17 DIAGNOSIS — N1832 Chronic kidney disease, stage 3b: Secondary | ICD-10-CM

## 2022-12-17 LAB — BASIC METABOLIC PANEL
BUN/Creatinine Ratio: 17 (ref 10–24)
BUN: 35 mg/dL — ABNORMAL HIGH (ref 8–27)
CO2: 27 mmol/L (ref 20–29)
Calcium: 9.2 mg/dL (ref 8.6–10.2)
Chloride: 96 mmol/L (ref 96–106)
Creatinine, Ser: 2.11 mg/dL — ABNORMAL HIGH (ref 0.76–1.27)
Glucose: 76 mg/dL (ref 70–99)
Potassium: 3.7 mmol/L (ref 3.5–5.2)
Sodium: 137 mmol/L (ref 134–144)
eGFR: 35 mL/min/{1.73_m2} — ABNORMAL LOW (ref >59)

## 2022-12-31 ENCOUNTER — Ambulatory Visit: Payer: 59 | Admitting: Family

## 2023-04-06 ENCOUNTER — Telehealth: Payer: Self-pay

## 2023-04-06 ENCOUNTER — Ambulatory Visit
Admission: RE | Admit: 2023-04-06 | Discharge: 2023-04-06 | Disposition: A | Discharge status: Home / Self Care | Payer: 59 | Source: Ambulatory Visit | Attending: Physician Assistant | Admitting: Physician Assistant

## 2023-04-06 VITALS — BP 170/100 | HR 126 | Temp 102.5°F | Resp 18 | Ht 64.0 in | Wt 121.7 lb

## 2023-04-06 DIAGNOSIS — R059 Cough, unspecified: Secondary | ICD-10-CM | POA: Diagnosis present

## 2023-04-06 DIAGNOSIS — J069 Acute upper respiratory infection, unspecified: Secondary | ICD-10-CM

## 2023-04-06 DIAGNOSIS — U071 COVID-19: Secondary | ICD-10-CM | POA: Diagnosis not present

## 2023-04-06 DIAGNOSIS — R7303 Prediabetes: Secondary | ICD-10-CM

## 2023-04-06 DIAGNOSIS — Z87891 Personal history of nicotine dependence: Secondary | ICD-10-CM | POA: Insufficient documentation

## 2023-04-06 LAB — POCT INFLUENZA A/B
Influenza A, POC: NEGATIVE
Influenza B, POC: NEGATIVE

## 2023-04-06 MED ORDER — BENZONATATE 100 MG PO CAPS
100.0000 mg | ORAL_CAPSULE | Freq: Three times a day (TID) | ORAL | 0 refills | Status: AC
Start: 2023-04-06 — End: ?

## 2023-04-06 MED ORDER — ACETAMINOPHEN 325 MG PO TABS
650.0000 mg | ORAL_TABLET | Freq: Once | ORAL | Status: AC
  Administered 2023-04-06: 650 mg via ORAL

## 2023-04-06 NOTE — ED Provider Notes (Signed)
[facility]    CSN: [id number] Arrival date & time: 04/06/23  1557      History   Chief Complaint Chief Complaint  Patient presents with   Chills    Appt     HPI George Hodges is a 62 y.o. male.   Patient here today for evaluation of chills, cough, headache and fever.  He has had symptoms for few days now.  He has not had any vomiting or diarrhea.  He has taken over-the-counter medication without resolution.  The history is provided by the patient.    History reviewed. No pertinent past medical history.  Patient Active Problem List   Diagnosis Date Noted   Prediabetes 12/03/2022    Past Surgical History:  Procedure Laterality Date   APPENDECTOMY     TENDON REPAIR Left 03/08/2014   Procedure: Left Wrist Wound Exploration with Tendon and Nerve Repair;  Surgeon: Sharma Covert, MD;  Location: [facility] OR;  Service: Orthopedics;  Laterality: Left;       Home Medications    Prior to Admission medications   Medication Sig Start Date End Date Taking? Authorizing Provider  benzonatate (TESSALON) 100 MG capsule Take 1 capsule (100 mg total) by mouth every 8 (eight) hours. 04/06/23  Yes Tomi Bamberger, PA-C  Pseudoephedrine-APAP-DM (DAYQUIL PO) Take by mouth.   Yes [provider]  valsartan (DIOVAN) 80 MG tablet Take 1 tablet (80 mg total) by mouth daily. 12/16/22 04/06/23 Yes Zonia Kief, Amy J, NP  loratadine (CLARITIN) 10 MG tablet Take 10 mg by mouth daily.    [provider]  nirmatrelvir/ritonavir (PAXLOVID) 20 x 150 MG & 10 x 100MG  TABS Take 3 tablets by mouth 2 (two) times daily for 5 days. Patient GFR is 73. Take nirmatrelvir (150 mg) two tablets twice daily for 5 days and ritonavir (100 mg) one tablet twice daily for 5 days. 04/07/23 04/12/23  Tomi Bamberger, PA-C    Family History History reviewed. No pertinent family history.  Social History Social History   Tobacco Use   Smoking status: Former    Types: Cigarettes   Smokeless tobacco:  Never  Vaping Use   Vaping status: Never Used  Substance Use Topics   Alcohol use: Not Currently    Comment: 1-2 cans of beer daily   Drug use: No     Allergies   Patient has no known allergies.   Review of Systems Review of Systems  Constitutional:  Positive for chills and fever.  HENT:  Positive for congestion and sore throat. Negative for ear pain.   Eyes:  Negative for discharge and redness.  Respiratory:  Positive for cough. Negative for shortness of breath.   Gastrointestinal:  Negative for abdominal pain, diarrhea, nausea and vomiting.  Neurological:  Positive for headaches.     Physical Exam Triage Vital Signs ED Triage Vitals  Encounter Vitals Group     BP 04/06/23 1635 (!) 168/100     Systolic BP Percentile --      Diastolic BP Percentile --      Pulse Rate 04/06/23 1635 (!) 126     Resp 04/06/23 1635 18     Temp 04/06/23 1635 (!) 102.5 F (39.2 C)     Temp Source 04/06/23 1635 Oral     SpO2 04/06/23 1635 96 %     Weight 04/06/23 1634 121 lb 11.1 oz (55.2 kg)     Height 04/06/23 1634 5\' 4"  (1.626 m)     Head Circumference --  Peak Flow --      Pain Score 04/06/23 1633 2     Pain Loc --      Pain Education --      Exclude from Growth Chart --    No data found.  Updated Vital Signs BP (!) 170/100 (BP Location: Left Arm)   Pulse (!) 126   Temp (!) 102.5 F (39.2 C) (Oral)   Resp 18   Ht 5\' 4"  (1.626 m)   Wt 121 lb 11.1 oz (55.2 kg)   SpO2 96%   BMI 20.89 kg/m     Physical Exam Vitals and nursing note reviewed.  Constitutional:      General: He is not in acute distress.    Appearance: Normal appearance. He is well-developed. He is not ill-appearing.  HENT:     Head: Normocephalic and atraumatic.     Nose: Congestion present.     Mouth/Throat:     Mouth: Mucous membranes are moist.     Pharynx: Posterior oropharyngeal erythema present. No oropharyngeal exudate.     Tonsils: 0 on the right. 0 on the left.  Eyes:     Conjunctiva/sclera:  Conjunctivae normal.  Cardiovascular:     Rate and Rhythm: Normal rate and regular rhythm.     Heart sounds: Normal heart sounds. No murmur heard. Pulmonary:     Effort: Pulmonary effort is normal. No respiratory distress.     Breath sounds: Normal breath sounds. No wheezing, rhonchi or rales.  Skin:    General: Skin is warm and dry.  Neurological:     Mental Status: He is alert.  Psychiatric:        Mood and Affect: Mood normal.        Behavior: Behavior normal.      UC Treatments / Results  Labs (all labs ordered are listed, but only abnormal results are displayed) Labs Reviewed  SARS CORONAVIRUS 2 (TAT 6-24 HRS) - Abnormal; Notable for the following components:      Result Value   SARS Coronavirus 2 POSITIVE (*)    All other components within normal limits  COMPREHENSIVE METABOLIC PANEL - Abnormal; Notable for the following components:   Glucose 106 (*)    Albumin 3.8 (*)    AST 50 (*)    ALT 45 (*)    All other components within normal limits   Narrative:    Performed at:  [address], [place], [place]  [id number] Lab Director: Jolene Schimke MD, Phone:  [phone]  CBC WITH DIFFERENTIAL/PLATELET - Abnormal; Notable for the following components:   WBC 13.1 (*)    Neutrophils Absolute 9.4 (*)    Monocytes Absolute 1.4 (*)    All other components within normal limits   Narrative:    Performed at:  [address], [place], [place]  [id number] Lab Director: Jolene Schimke MD, Phone:  [phone]  HEMOGLOBIN A1C   Narrative:    Performed at:  [address] [facility] [address] [place], [place]  [id number] Lab Director: Jolene Schimke MD, Phone:  [phone]  CBC WITH DIFFERENTIAL/PLATELET  HEMOGLOBIN A1C  POCT INFLUENZA A/B    EKG   Radiology No results found.  Procedures Procedures (including critical care time)  Medications Ordered in UC Medications  acetaminophen (TYLENOL) tablet 650 mg (650 mg Oral  Given 04/06/23 1641)    Initial Impression / Assessment and Plan / UC Course  I have reviewed the triage vital signs and the  nursing notes.  Pertinent labs & imaging results that were available during my care of the patient were reviewed by me and considered in my medical decision making (see chart for details).   Flu screen negative in office.  Will screen for COVID.  Discussed Tylenol for fever, last kidney function showed significant increase in creatinine.  Apparently there was a miscommunication and they were unaware that they were supposed to follow-up with primary care within the month.  Patient has not had creatinine rechecked since then.  Will order labs including CMP as well as hemoglobin A1c and CBC.  Encouraged follow-up in the emergency room with any worsening symptoms while awaiting lab results.  Encouraged follow-up with PCP as soon as possible.   Final Clinical Impressions(s) / UC Diagnoses   Final diagnoses:  Acute upper respiratory infection  Prediabetes   Discharge Instructions   None    ED Prescriptions     Medication Sig Dispense Auth. Provider   benzonatate (TESSALON) 100 MG capsule Take 1 capsule (100 mg total) by mouth every 8 (eight) hours. 21 capsule Tomi Bamberger, PA-C      PDMP not reviewed this encounter.   Tomi Bamberger, PA-C 04/11/23 [phone]

## 2023-04-06 NOTE — Telephone Encounter (Signed)
Patient daughter is calling to speak with the provider regarding her Father. (DPR signed previously, See FYI).  Original Call:  George Hodges just called in for her father George Hodges mrn [id number]. She would like to speak to someone regarding his visit today. [phone]  Forwarded to R. Izola Price PA

## 2023-04-06 NOTE — ED Triage Notes (Signed)
Cold or flu symptoms - Entered by patient  Here with Daughter (requesting to interpret if needed). "He has had the chills, cough for a few days now with ha". No fever.

## 2023-04-07 ENCOUNTER — Telehealth: Payer: Self-pay

## 2023-04-07 LAB — CBC WITH DIFFERENTIAL/PLATELET
Basophils Absolute: 0 10*3/uL (ref 0.0–0.2)
Basos: 0 %
EOS (ABSOLUTE): 0.1 10*3/uL (ref 0.0–0.4)
Eos: 1 %
Hematocrit: 45.8 % (ref 37.5–51.0)
Hemoglobin: 15.1 g/dL (ref 13.0–17.7)
Immature Grans (Abs): 0 10*3/uL (ref 0.0–0.1)
Immature Granulocytes: 0 %
Lymphocytes Absolute: 2.1 10*3/uL (ref 0.7–3.1)
Lymphs: 16 %
MCH: 27.6 pg (ref 26.6–33.0)
MCHC: 33 g/dL (ref 31.5–35.7)
MCV: 84 fL (ref 79–97)
Monocytes Absolute: 1.4 10*3/uL — ABNORMAL HIGH (ref 0.1–0.9)
Monocytes: 11 %
Neutrophils Absolute: 9.4 10*3/uL — ABNORMAL HIGH (ref 1.4–7.0)
Neutrophils: 72 %
Platelets: 185 10*3/uL (ref 150–450)
RBC: 5.47 x10E6/uL (ref 4.14–5.80)
RDW: 12.5 % (ref 11.6–15.4)
WBC: 13.1 10*3/uL — ABNORMAL HIGH (ref 3.4–10.8)

## 2023-04-07 LAB — COMPREHENSIVE METABOLIC PANEL
ALT: 45 IU/L — ABNORMAL HIGH (ref 0–44)
AST: 50 IU/L — ABNORMAL HIGH (ref 0–40)
Albumin: 3.8 g/dL — ABNORMAL LOW (ref 3.9–4.9)
Alkaline Phosphatase: 80 IU/L (ref 44–121)
BUN/Creatinine Ratio: 13 (ref 10–24)
BUN: 15 mg/dL (ref 8–27)
Bilirubin Total: 0.3 mg/dL (ref 0.0–1.2)
CO2: 23 mmol/L (ref 20–29)
Calcium: 8.9 mg/dL (ref 8.6–10.2)
Chloride: 100 mmol/L (ref 96–106)
Creatinine, Ser: 1.12 mg/dL (ref 0.76–1.27)
Globulin, Total: 3.7 g/dL (ref 1.5–4.5)
Glucose: 106 mg/dL — ABNORMAL HIGH (ref 70–99)
Potassium: 5 mmol/L (ref 3.5–5.2)
Sodium: 137 mmol/L (ref 134–144)
Total Protein: 7.5 g/dL (ref 6.0–8.5)
eGFR: 73 mL/min/{1.73_m2} (ref >59)

## 2023-04-07 LAB — HEMOGLOBIN A1C
Est. average glucose Bld gHb Est-mCnc: 114 mg/dL
Hgb A1c MFr Bld: 5.6 % (ref 4.8–5.6)

## 2023-04-07 LAB — SARS CORONAVIRUS 2 (TAT 6-24 HRS): SARS Coronavirus 2: POSITIVE — AB

## 2023-04-07 MED ORDER — NIRMATRELVIR/RITONAVIR (PAXLOVID)TABLET: 3.0000 | ORAL_TABLET | Freq: Two times a day (BID) | ORAL | 0 refills | Status: AC

## 2023-04-07 NOTE — Telephone Encounter (Signed)
Spoke with daughter. Lurena Joiner is calling in Paxlovid for patient. Patient work note updated.

## 2023-04-07 NOTE — Addendum Note (Signed)
Addended by: Erma Pinto on: 04/07/2023 07:55 PM   Modules accepted: Orders

## 2023-04-07 NOTE — Telephone Encounter (Signed)
Paxlovid sent to pharmacy as requested.

## 2023-04-10 ENCOUNTER — Telehealth: Payer: Self-pay

## 2023-04-10 NOTE — Telephone Encounter (Signed)
Returning call to daughter (current DPR on file).  Daughter would like an updated letter/note like the following:  George Hodges was seen and treated in our Urgent Care on 04/06/2023. He may return to work on Monday 04/13/23 if he is 24 hours of symptoms improving and fever free with no medications per CDC guidelines, with no restrictions.  Patient was unable to p/u and start Paxlovid until later due to pharmacy change. He remains Symptomatic.  B. Roten CMA  Sent to R. Izola Price PA-C

## 2023-04-10 NOTE — Telephone Encounter (Signed)
Returned call to Daughter. Notified of the following:   Tomi Bamberger, PA-C  You38 minutes ago (12:18 PM)    Recommend follow up if symptoms persist next week. Advise ED for any worsening.   New Letter/Work note sent via MyChart and printed for p/u.  B. Roten CMA

## 2023-04-21 ENCOUNTER — Encounter: Payer: Self-pay | Admitting: Internal Medicine

## 2023-05-07 ENCOUNTER — Other Ambulatory Visit: Payer: Self-pay

## 2023-05-07 ENCOUNTER — Other Ambulatory Visit: Payer: Self-pay | Admitting: Family

## 2023-05-07 DIAGNOSIS — I10 Essential (primary) hypertension: Secondary | ICD-10-CM

## 2023-05-07 MED ORDER — VALSARTAN 80 MG PO TABS: 80.0000 mg | ORAL_TABLET | Freq: Every day | ORAL | 0 refills | Status: DC

## 2023-05-07 MED ORDER — VALSARTAN 80 MG PO TABS: 80.0000 mg | ORAL_TABLET | Freq: Every day | ORAL | 0 refills | Status: AC

## 2023-05-24 ENCOUNTER — Other Ambulatory Visit: Payer: Self-pay | Admitting: Family

## 2023-05-24 DIAGNOSIS — I10 Essential (primary) hypertension: Secondary | ICD-10-CM

## 2023-05-25 ENCOUNTER — Other Ambulatory Visit: Payer: Self-pay | Admitting: Family

## 2023-05-25 DIAGNOSIS — I10 Essential (primary) hypertension: Secondary | ICD-10-CM

## 2023-05-25 NOTE — Telephone Encounter (Signed)
Medication Refill -  Most Recent Primary Care Visit:  Provider: Rema Fendt  Department: [facility]  Visit Type: OFFICE VISIT  Date: 12/16/2022  Medication: valsartan (DIOVAN) 80 MG tablet [[id number]]   Has the patient contacted their pharmacy? Yes (Agent: If no, request that the patient contact the pharmacy for the refill. If patient does not wish to contact the pharmacy document the reason why and proceed with request.) (Agent: If yes, when and what did the pharmacy advise?)  Is this the correct pharmacy for this prescription? Yes If no, delete pharmacy and type the correct one.  This is the patient's preferred pharmacy:  [id number] [place], [place] - [phone] AT [facility] OF [facility] & [address] [place] [address] Phone: [phone] Fax: [phone]   Has the prescription been filled recently? Yes  Is the patient out of the medication? Yes  Has the patient been seen for an appointment in the last year OR does the patient have an upcoming appointment? Yes 06/15/23  Can we respond through MyChart? YES  Agent: Please be advised that Rx refills may take up to 3 business days. We ask that you follow-up with your pharmacy.

## 2023-05-26 NOTE — Telephone Encounter (Signed)
Call to pharmacy- Rx was picked up 05/25/23- confirmed- will close request Requested Prescriptions  Pending Prescriptions Disp Refills   valsartan (DIOVAN) 80 MG tablet 90 tablet 0    Sig: Take 1 tablet (80 mg total) by mouth daily.     Cardiovascular:  Angiotensin Receptor Blockers Failed - 05/25/2023  1:42 PM      Failed - Last BP in normal range    BP Readings from Last 1 Encounters:  04/06/23 (!) 170/100         Passed - Cr in normal range and within 180 days    Creatinine, Ser  Date Value Ref Range Status  04/06/2023 1.12 0.76 - 1.27 mg/dL Final         Passed - K in normal range and within 180 days    Potassium  Date Value Ref Range Status  04/06/2023 5.0 3.5 - 5.2 mmol/L Final         Passed - Patient is not pregnant      Passed - Valid encounter within last 6 months    Recent Outpatient Visits           5 months ago Primary hypertension   [facility], [place], [place]   5 months ago Primary hypertension   [facility], Amy J, NP   6 months ago Primary hypertension   [facility], [place], [place]   6 months ago Encounter to establish care   [facility] at [facility], Amy J, NP   7 years ago Cough   [facility] at [facility], Asencion Partridge, MD       Future Appointments             In 2 weeks Rema Fendt, NP [facility]

## 2023-06-15 ENCOUNTER — Ambulatory Visit: Payer: 59 | Admitting: Family

## 2023-07-20 ENCOUNTER — Ambulatory Visit: Payer: 59 | Admitting: Internal Medicine

## 2023-09-08 ENCOUNTER — Telehealth: Payer: Self-pay | Admitting: Family

## 2023-09-08 NOTE — Telephone Encounter (Signed)
 Patient needs refill on medication patient has appointment scheduled next Tuesday but will run out before then

## 2023-09-08 NOTE — Telephone Encounter (Signed)
 Called patient to figure out which medication he needs to hold him over until appointment. Left voicemail to call office back.

## 2023-09-10 NOTE — Telephone Encounter (Signed)
 Left voicemail to call office to figure out medications.

## 2023-09-15 ENCOUNTER — Ambulatory Visit: Payer: 59 | Admitting: Family

## 2023-09-15 ENCOUNTER — Encounter: Payer: Self-pay | Admitting: Family

## 2023-09-15 VITALS — BP 161/90 | HR 71 | Temp 97.7°F | Ht 64.5 in | Wt 130.0 lb

## 2023-09-15 DIAGNOSIS — I10 Essential (primary) hypertension: Secondary | ICD-10-CM | POA: Diagnosis not present

## 2023-09-15 DIAGNOSIS — N189 Chronic kidney disease, unspecified: Secondary | ICD-10-CM | POA: Diagnosis not present

## 2023-09-15 DIAGNOSIS — Z13220 Encounter for screening for lipoid disorders: Secondary | ICD-10-CM

## 2023-09-15 MED ORDER — AMLODIPINE BESYLATE 5 MG PO TABS
5.0000 mg | ORAL_TABLET | Freq: Every day | ORAL | 0 refills | Status: DC
Start: 2023-09-15 — End: 2023-12-11

## 2023-09-15 MED ORDER — VALSARTAN 80 MG PO TABS: 80.0000 mg | ORAL_TABLET | Freq: Every day | ORAL | 0 refills | Status: DC

## 2023-09-15 NOTE — Progress Notes (Signed)
 Patient states nothing else to discuss.

## 2023-09-15 NOTE — Progress Notes (Signed)
 Patient ID: George Hodges, male    DOB: [birth year]  MRN: [id number]  CC: Chronic Conditions Follow-Up  Subjective: George Hodges is a 63 y.o. male who presents for chronic conditions follow-up. He is accompanied by one daughter in-person and one daughter by phone.  His concerns today include:  - Doing well on Valsartan, no issues/concerns. Has not taken Valsartan in 2 weeks due to needing refills. Home blood pressures above goal. He does not complain of red flag symptoms such as but not limited to chest pain, shortness of breath, worst headache of life, nausea/vomiting. Requests referral to Cardiology [facility]. - Cholesterol lab. - History of decreased kidney function 12/16/2022. Kidney function normal 04/06/2023. Requests referral to Nephrology.  - No further issues/concerns for discussion today.   Patient Active Problem List   Diagnosis Date Noted   Prediabetes 12/03/2022     Current Outpatient Medications on File Prior to Visit  Medication Sig Dispense Refill   loratadine (CLARITIN) 10 MG tablet Take 10 mg by mouth daily.     Pseudoephedrine-APAP-DM (DAYQUIL PO) Take by mouth.     benzonatate (TESSALON) 100 MG capsule Take 1 capsule (100 mg total) by mouth every 8 (eight) hours. (Patient not taking: Reported on 09/15/2023) 21 capsule 0   valsartan (DIOVAN) 80 MG tablet Take 1 tablet (80 mg total) by mouth daily. 90 tablet 0   No current facility-administered medications on file prior to visit.    No Known Allergies  Social History   Socioeconomic History   Marital status: Married    Spouse name: Not on file   Number of children: Not on file   Years of education: Not on file   Highest education level: Not on file  Occupational History   Not on file  Tobacco Use   Smoking status: Former    Types: Cigarettes   Smokeless tobacco: Never  Vaping Use   Vaping status: Never Used  Substance and Sexual Activity   Alcohol use: Not Currently    Comment: 1-2 cans of beer daily   Drug  use: No   Sexual activity: Not Currently  Other Topics Concern   Not on file  Social History Narrative   Not on file   Social Drivers of Health   Financial Resource Strain: Not on file  Food Insecurity: Not on file  Transportation Needs: Not on file  Physical Activity: Not on file  Stress: Not on file  Social Connections: Unknown (11/22/2021)   Received from [facility]   Social Network    Social Network: Not on file  Intimate Partner Violence: Unknown (10/14/2021)   Received from [facility]   HITS    Physically Hurt: Not on file    Insult or Talk Down To: Not on file    Threaten Physical Harm: Not on file    Scream or Curse: Not on file    No family history on file.  Past Surgical History:  Procedure Laterality Date   APPENDECTOMY     TENDON REPAIR Left 03/08/2014   Procedure: Left Wrist Wound Exploration with Tendon and Nerve Repair;  Surgeon: Sharma Covert, MD;  Location: [facility] OR;  Service: Orthopedics;  Laterality: Left;    ROS: Review of Systems Negative except as stated above  PHYSICAL EXAM: BP (!) 161/90   Pulse 71   Temp 97.7 F (36.5 C) (Oral)   Ht 5' 4.5" (1.638 m)   Wt 130 lb (59 kg)   SpO2 97%  BMI 21.97 kg/m   Physical Exam HENT:     Head: Normocephalic and atraumatic.     Nose: Nose normal.     Mouth/Throat:     Mouth: Mucous membranes are moist.     Pharynx: Oropharynx is clear.  Eyes:     Extraocular Movements: Extraocular movements intact.     Conjunctiva/sclera: Conjunctivae normal.     Pupils: Pupils are equal, round, and reactive to light.  Cardiovascular:     Rate and Rhythm: Normal rate and regular rhythm.     Pulses: Normal pulses.     Heart sounds: Normal heart sounds.  Pulmonary:     Effort: Pulmonary effort is normal.     Breath sounds: Normal breath sounds.  Musculoskeletal:        General: Normal range of motion.     Cervical back: Normal range of motion and neck supple.  Neurological:      General: No focal deficit present.     Mental Status: He is alert and oriented to person, place, and time.  Psychiatric:        Mood and Affect: Mood normal.        Behavior: Behavior normal.     ASSESSMENT AND PLAN: 1. Primary hypertension (Primary) - Blood pressure not at goal during today's visit. Patient asymptomatic without chest pressure, chest pain, palpitations, shortness of breath, worst headache of life, and any additional red flag symptoms. - Continue Valsartan as prescribed.  - Begin Amlodipine as prescribed.  - Patent intolerant to Hydrochlorothiazide.  - Routine screening.  - Counseled on blood pressure goal of less than 130/80, low-sodium, DASH diet, medication compliance, and 150 minutes of moderate intensity exercise per week as tolerated. Counseled on medication adherence and adverse effects. - Referral to Cardiology for evaluation/management.  - Follow-up with primary provider as scheduled. - valsartan (DIOVAN) 80 MG tablet; Take 1 tablet (80 mg total) by mouth daily.  Dispense: 90 tablet; Refill: 0 - amLODipine (NORVASC) 5 MG tablet; Take 1 tablet (5 mg total) by mouth daily.  Dispense: 90 tablet; Refill: 0 - Basic Metabolic Panel - Ambulatory referral to Cardiology  2. Screening cholesterol level - Routine screening.  - Lipid panel  3. Chronic kidney disease, unspecified CKD stage - History of decreased kidney function 12/16/2022.  - Kidney function normal 04/06/2023. - Routine screening.  - Referral to Nephrology for evaluation/management. - Basic Metabolic Panel - Ambulatory referral to Nephrology       Patient was given the opportunity to ask questions.  Patient verbalized understanding of the plan and was able to repeat key elements of the plan. Patient was given clear instructions to go to Emergency Department or return to medical center if symptoms don't improve, worsen, or new problems develop.The patient verbalized understanding.   Orders  Placed This Encounter  Procedures   Basic Metabolic Panel   Lipid panel   Ambulatory referral to Cardiology   Ambulatory referral to Nephrology     Requested Prescriptions   Signed Prescriptions Disp Refills   valsartan (DIOVAN) 80 MG tablet 90 tablet 0    Sig: Take 1 tablet (80 mg total) by mouth daily.   amLODipine (NORVASC) 5 MG tablet 90 tablet 0    Sig: Take 1 tablet (5 mg total) by mouth daily.    No follow-ups on file.  Rema Fendt, NP

## 2023-09-16 ENCOUNTER — Encounter: Payer: Self-pay | Admitting: Family

## 2023-09-16 LAB — BASIC METABOLIC PANEL
BUN/Creatinine Ratio: 20 (ref 10–24)
BUN: 16 mg/dL (ref 8–27)
CO2: 25 mmol/L (ref 20–29)
Calcium: 9.6 mg/dL (ref 8.6–10.2)
Chloride: 104 mmol/L (ref 96–106)
Creatinine, Ser: 0.81 mg/dL (ref 0.76–1.27)
Glucose: 95 mg/dL (ref 70–99)
Potassium: 4.9 mmol/L (ref 3.5–5.2)
Sodium: 140 mmol/L (ref 134–144)
eGFR: 98 mL/min/{1.73_m2} (ref >59)

## 2023-09-16 LAB — LIPID PANEL
Chol/HDL Ratio: 3 ratio (ref 0.0–5.0)
Cholesterol, Total: 168 mg/dL (ref 100–199)
HDL: 56 mg/dL (ref >39)
LDL Chol Calc (NIH): 87 mg/dL (ref 0–99)
Triglycerides: 142 mg/dL (ref 0–149)
VLDL Cholesterol Cal: 25 mg/dL (ref 5–40)

## 2023-12-11 ENCOUNTER — Other Ambulatory Visit: Payer: Self-pay | Admitting: Family

## 2023-12-11 DIAGNOSIS — I10 Essential (primary) hypertension: Secondary | ICD-10-CM

## 2023-12-11 NOTE — Telephone Encounter (Signed)
 Complete

## 2023-12-14 ENCOUNTER — Other Ambulatory Visit: Payer: Self-pay | Admitting: Family

## 2023-12-14 DIAGNOSIS — I10 Essential (primary) hypertension: Secondary | ICD-10-CM

## 2023-12-15 NOTE — Telephone Encounter (Signed)
 Requested Prescriptions  Pending Prescriptions Disp Refills   valsartan  (DIOVAN ) 80 MG tablet [Pharmacy Med Name: VALSARTAN  80MG  TABLETS] 90 tablet 0    Sig: TAKE 1 TABLET(80 MG) BY MOUTH DAILY     Cardiovascular:  Angiotensin Receptor Blockers Failed - 12/15/2023 10:16 AM      Failed - Last BP in normal range    BP Readings from Last 1 Encounters:  09/15/23 (!) 161/90         Passed - Cr in normal range and within 180 days    Creatinine, Ser  Date Value Ref Range Status  09/15/2023 0.81 0.76 - 1.27 mg/dL Final         Passed - K in normal range and within 180 days    Potassium  Date Value Ref Range Status  09/15/2023 4.9 3.5 - 5.2 mmol/L Final         Passed - Patient is not pregnant      Passed - Valid encounter within last 6 months    Recent Outpatient Visits           3 months ago Primary hypertension   [place] [facility], [place], [place]   12 months ago Primary hypertension   [place] [facility], [place], [place]   1 year ago Primary hypertension   [place] [facility], [place], NP   1 year ago Primary hypertension   [place] [facility], [place], [place]   1 year ago Encounter to establish care   [facility], Annalee Barren, NP       Future Appointments             In 2 months Patwardhan, Kaye Parsons, MD [facility] Dept of [employer] [facility] [employer]

## 2024-01-23 ENCOUNTER — Ambulatory Visit
Admission: RE | Admit: 2024-01-23 | Discharge: 2024-01-23 | Disposition: A | Discharge status: Home / Self Care | Attending: Physician Assistant | Admitting: Physician Assistant

## 2024-01-23 ENCOUNTER — Other Ambulatory Visit: Payer: Self-pay

## 2024-01-23 ENCOUNTER — Ambulatory Visit (INDEPENDENT_AMBULATORY_CARE_PROVIDER_SITE_OTHER): Admitting: Radiology

## 2024-01-23 VITALS — BP 154/87 | HR 63 | Temp 98.0°F | Resp 16 | Ht 64.5 in | Wt 130.1 lb

## 2024-01-23 DIAGNOSIS — R0689 Other abnormalities of breathing: Secondary | ICD-10-CM

## 2024-01-23 DIAGNOSIS — R062 Wheezing: Secondary | ICD-10-CM | POA: Diagnosis not present

## 2024-01-23 DIAGNOSIS — W19XXXA Unspecified fall, initial encounter: Secondary | ICD-10-CM

## 2024-01-23 DIAGNOSIS — R058 Other specified cough: Secondary | ICD-10-CM | POA: Diagnosis not present

## 2024-01-23 DIAGNOSIS — R011 Cardiac murmur, unspecified: Secondary | ICD-10-CM

## 2024-01-23 LAB — POC COVID19/FLU A&B COMBO
Covid Antigen, POC: NEGATIVE
Influenza A Antigen, POC: NEGATIVE
Influenza B Antigen, POC: NEGATIVE

## 2024-01-23 LAB — POCT RAPID STREP A (OFFICE): Rapid Strep A Screen: NEGATIVE

## 2024-01-23 MED ORDER — AZITHROMYCIN 250 MG PO TABS: ORAL_TABLET | ORAL | 0 refills | Status: AC

## 2024-01-23 MED ORDER — AMOXICILLIN-POT CLAVULANATE 875-125 MG PO TABS: 1.0000 | ORAL_TABLET | Freq: Two times a day (BID) | ORAL | 0 refills | Status: DC

## 2024-01-23 MED ORDER — PREDNISONE 20 MG PO TABS: 40.0000 mg | ORAL_TABLET | Freq: Every day | ORAL | 0 refills | Status: AC

## 2024-01-23 NOTE — ED Provider Notes (Signed)
 [facility]    CSN: [id number] Arrival date & time: 01/23/24  1503      History   Chief Complaint Chief Complaint  Patient presents with   Cough    Weakness in body and syncopal episode - Entered by patient   Fall    HPI George Hodges is a 63 y.o. male.   HPI  Pt is here with his son who is providing translation assistance He states that the patient has had productive coughing for about a week He has been taking mucinex but this has not provided any relief  Pt's son reports that patient's wife was sick about 2 weeks ago with similar symptoms   His son states that his mother told him that the patient fell earlier today but did not hit his head The patient does not remember this incident but thinks he fell onto his buttocks. He denies headache, vision changes, dizziness, unilateral weakness or numbness, facial drooping, difficulty speaking     Past Medical History:  Diagnosis Date   CKD (chronic kidney disease)    HTN (hypertension)    Prediabetes 12/03/2022    Patient Active Problem List   Diagnosis Date Noted   Prediabetes 12/03/2022    Past Surgical History:  Procedure Laterality Date   APPENDECTOMY     TENDON REPAIR Left 03/08/2014   Procedure: Left Wrist Wound Exploration with Tendon and Nerve Repair;  Surgeon: Prentice LELON Pagan, MD;  Location: [facility] OR;  Service: Orthopedics;  Laterality: Left;       Home Medications    Prior to Admission medications   Medication Sig Start Date End Date Taking? Authorizing Provider  amoxicillin -clavulanate (AUGMENTIN ) 875-125 MG tablet Take 1 tablet by mouth every 12 (twelve) hours. 01/23/24  Yes Harshika Mago E, PA-C  azithromycin  (ZITHROMAX ) 250 MG tablet Take 2 tablets (500 mg total) by mouth daily for 1 day, THEN 1 tablet (250 mg total) daily for 4 days. 01/23/24 01/28/24 Yes Ariyonna Twichell E, PA-C  predniSONE  (DELTASONE ) 20 MG tablet Take 2 tablets (40 mg total) by mouth daily for 5 days. 01/23/24 01/28/24 Yes Shamela Haydon  E, PA-C  amLODipine  (NORVASC ) 5 MG tablet TAKE 1 TABLET(5 MG) BY MOUTH DAILY 12/11/23   Lorren Greig PARAS, NP  benzonatate  (TESSALON ) 100 MG capsule Take 1 capsule (100 mg total) by mouth every 8 (eight) hours. Patient not taking: Reported on 09/15/2023 04/06/23   Billy Asberry FALCON, PA-C  loratadine (CLARITIN) 10 MG tablet Take 10 mg by mouth daily.    [provider]  Pseudoephedrine-APAP-DM (DAYQUIL PO) Take by mouth.    [provider]  valsartan  (DIOVAN ) 80 MG tablet Take 1 tablet (80 mg total) by mouth daily. 05/07/23 08/05/23  Lorren Greig PARAS, NP  valsartan  (DIOVAN ) 80 MG tablet TAKE 1 TABLET(80 MG) BY MOUTH DAILY 12/15/23   Lorren Greig PARAS, NP    Family History History reviewed. No pertinent family history.  Social History Social History   Tobacco Use   Smoking status: Former    Types: Cigarettes   Smokeless tobacco: Never  Vaping Use   Vaping status: Never Used  Substance Use Topics   Alcohol use: Not Currently    Comment: 1-2 cans of beer daily   Drug use: No     Allergies   Patient has no known allergies.   Review of Systems Review of Systems  Constitutional:  Negative for chills and fever.  HENT:  Positive for sore throat. Negative for congestion, ear pain and  postnasal drip.   Respiratory:  Positive for cough. Negative for shortness of breath and wheezing.   Gastrointestinal:  Negative for diarrhea, nausea and vomiting.  Musculoskeletal:  Negative for myalgias.  Skin:  Negative for rash.  Neurological:  Negative for dizziness, facial asymmetry, speech difficulty, weakness, light-headedness, numbness and headaches.     Physical Exam Triage Vital Signs ED Triage Vitals  Encounter Vitals Group     BP 01/23/24 1519 (!) 154/87     Girls Systolic BP Percentile --      Girls Diastolic BP Percentile --      Boys Systolic BP Percentile --      Boys Diastolic BP Percentile --      Pulse Rate 01/23/24 1519 63     Resp 01/23/24 1519 16     Temp 01/23/24  1519 98 F (36.7 C)     Temp Source 01/23/24 1519 Oral     SpO2 01/23/24 1519 96 %     Weight 01/23/24 1521 130 lb 1.1 oz (59 kg)     Height 01/23/24 1521 5' 4.5 (1.638 m)     Head Circumference --      Peak Flow --      Pain Score 01/23/24 1521 10     Pain Loc --      Pain Education --      Exclude from Growth Chart --    No data found.  Updated Vital Signs BP (!) 154/87 (BP Location: Right Arm)   Pulse 63   Temp 98 F (36.7 C) (Oral)   Resp 16   Ht 5' 4.5 (1.638 m)   Wt 130 lb 1.1 oz (59 kg)   SpO2 96%   BMI 21.98 kg/m   Visual Acuity Right Eye Distance:   Left Eye Distance:   Bilateral Distance:    Right Eye Near:   Left Eye Near:    Bilateral Near:     Physical Exam Vitals reviewed.  Constitutional:      General: He is awake. He is not in acute distress.    Appearance: Normal appearance. He is well-developed and well-groomed. He is not ill-appearing or toxic-appearing.  HENT:     Head: Normocephalic and atraumatic.     Right Ear: Hearing and ear canal normal. Tympanic membrane is retracted. Tympanic membrane is not injected, scarred, perforated, erythematous or bulging.     Left Ear: Hearing and ear canal normal. Tympanic membrane is retracted. Tympanic membrane is not injected, scarred, perforated, erythematous or bulging.     Ears:     Comments: Patient has white, powdery appearing substance in the canals of both ears as well as somewhat coating the tympanic membranes    Mouth/Throat:     Lips: Pink.     Mouth: Mucous membranes are moist.     Pharynx: Oropharynx is clear. Uvula midline. Posterior oropharyngeal erythema present. No pharyngeal swelling, oropharyngeal exudate, uvula swelling or postnasal drip.     Tonsils: No tonsillar exudate or tonsillar abscesses.  Eyes:     General: Lids are normal. Gaze aligned appropriately.     Extraocular Movements: Extraocular movements intact.  Cardiovascular:     Rate and Rhythm: Normal rate and regular rhythm.      Pulses: Normal pulses.          Radial pulses are 2+ on the right side and 2+ on the left side.     Heart sounds: Murmur heard.     Systolic murmur is present with a  grade of 3/6.     No friction rub. No gallop.  Pulmonary:     Effort: Pulmonary effort is normal.     Breath sounds: No decreased air movement. Examination of the right-upper field reveals wheezing. Examination of the left-upper field reveals wheezing. Examination of the right-middle field reveals wheezing. Examination of the right-lower field reveals decreased breath sounds. Examination of the left-lower field reveals decreased breath sounds. Decreased breath sounds and wheezing present. No rhonchi or rales.  Musculoskeletal:     Cervical back: Normal range of motion and neck supple. No pain with movement or muscular tenderness. Normal range of motion.     Right lower leg: No edema.     Left lower leg: No edema.  Lymphadenopathy:     Head:     Right side of head: No submental, submandibular or preauricular adenopathy.     Left side of head: No submental, submandibular or preauricular adenopathy.     Cervical:     Right cervical: No superficial cervical adenopathy.    Left cervical: No superficial cervical adenopathy.     Upper Body:     Right upper body: No supraclavicular adenopathy.     Left upper body: No supraclavicular adenopathy.  Skin:    General: Skin is warm and dry.  Neurological:     General: No focal deficit present.     Mental Status: He is alert and oriented to person, place, and time.     GCS: GCS eye subscore is 4. GCS verbal subscore is 5. GCS motor subscore is 6.     Comments: Comments: MENTAL STATUS: AAOx3, memory intact, fund of knowledge appropriate   LANG/SPEECH: Naming and repetition intact, fluent, no dysarthria, follows 3-step commands, answers questions appropriately     CRANIAL NERVES:   II: Pupils equal and reactive, no RAPD   III, IV, VI: EOM intact, no gaze preference or deviation,  no nystagmus.   V: normal sensation in V1, V2, and V3 segments bilaterally   VII: no asymmetry, no nasolabial fold flattening   VIII: normal hearing to speech   IX, X: normal palatal elevation, no uvular deviation   XI: 5/5 head turn and 5/5 shoulder shrug bilaterally   XII: midline tongue protrusion   MOTOR:  5/5 bilateral grip strength 5/5 strength dorsiflexion/plantarflexion b/l   COORD: Normal finger to nose, no tremor, no dysmetria   STATION: normal stance, no truncal ataxia   GAIT: Normal; patient able to tip-toe, heel-walk.   Psychiatric:        Mood and Affect: Mood normal.        Behavior: Behavior normal. Behavior is cooperative.        Thought Content: Thought content normal.        Judgment: Judgment normal.      UC Treatments / Results  Labs (all labs ordered are listed, but only abnormal results are displayed) Labs Reviewed  POCT RAPID STREP A (OFFICE)  POC COVID19/FLU A&B COMBO    EKG   Radiology DG Chest 2 View Result Date: 01/23/2024 CLINICAL DATA:  Wheezing.  Decreased lung sounds at the bases. EXAM: CHEST - 2 VIEW COMPARISON:  07/30/2022 FINDINGS: Lung volumes are low. The heart is enlarged but stable. Mediastinal contours are unchanged. Interstitial and peribronchial thickening is chronic but increased from prior exam. No focal airspace disease. Subsegmental scarring. No pneumothorax or pleural effusion. Stable calcifications in the left upper quadrant of the abdomen. No acute osseous findings. IMPRESSION: Low lung volumes. Interstitial and  peribronchial thickening is chronic but increased from prior exam, may represent bronchitis or atypical infection. Electronically Signed   By: Andrea Gasman M.D.   On: 01/23/2024 16:27    Procedures Procedures (including critical care time)  Medications Ordered in UC Medications - No data to display  Initial Impression / Assessment and Plan / UC Course  I have reviewed the triage vital signs and the nursing  notes.  Pertinent labs & imaging results that were available during my care of the patient were reviewed by me and considered in my medical decision making (see chart for details).      Final Clinical Impressions(s) / UC Diagnoses   Final diagnoses:  Wheezing  Decreased lung sounds  Heart murmur, systolic  Productive cough  Fall, initial encounter   Patient is here with his son today.  They report concerns for persistent, productive coughing has been present for over a week and is not improving with home measures.  Patient's son also reports concerns for a fall that occurred earlier today while the patient was out shopping with his wife.  The patient does not seem to remember the fall but both patient's son and patient deny that the patient hit his head.  They deny headache, nausea or vomiting, vision changes, confusion or change from patient's baseline mental status.  Neurological exam is largely intact and normal.  Physical exam is notable for diffuse wheezing and decreased lung sounds at bilateral lung bases.  Chest x-ray is concerning for diffuse opacities which seems different from previous chest x-ray performed on 07/30/2022.  Given findings as well as HPI and presentation I am concerned for potential pneumonia so we will start CAP protocol with Augmentin  p.o. twice daily x 7 days, Z-Pak, prednisone  40 mg p.o. daily x 5-day burst.  Recommend continued use of over-the-counter medications as needed for further symptomatic relief. Another notable finding on exam was potential new grade 3 systolic heart murmur.  Patient's son states that he is unaware of patient having a previous heart murmur.  Patient appears to have upcoming appointment with cardiology in August so recommend he keep this appointment for further evaluation and ongoing management. ED and return precautions reviewed and provided in AVS. Follow up as needed and indicated for cardiac concerns and acute illness.     Discharge  Instructions      You were seen today for concerns of a persistent, productive cough that has been ongoing for over a week.  Your exam was notable for decreased breath sounds at the bases of your lungs and wheezing so I am concerned that you may be developing pneumonia.  Your chest x-ray is notable for opacities that may be consistent with pneumonia so I am starting you on a medication regimen to treat this.  This is comprised of 2 antibiotics called Augmentin  and azithromycin .  You should take the Augmentin  by mouth twice per day for 7 days.  The azithromycin  also known as a Z-Pak should be taken per the instructions on the package. To help reduce your coughing and improve your breathing I am sending in a steroid called prednisone  for you to take by mouth once per day in the morning with breakfast.  I think it is important to note that during your exam I did hear a moderate heart murmur.  You have an upcoming appointment with cardiology in August so I suggest that you keep this appointment to make sure that everything is okay with your heart  I have sent  in a script for Prednisone  burst to be taken in the morning with breakfast per the instructions on the container Remember that steroids can cause sleeplessness, irritability, increased hunger and elevated glucose levels so be mindful of these side effects.   As needed you can continue to take over-the-counter medications such as DayQuil/NyQuil, TheraFlu, Alka-Seltzer or regular Robitussin and Mucinex to assist with the coughing and discomfort.  You can take over-the-counter Tylenol  as needed for pain and fever control.  Your family members report that you fell earlier today.  Your neurological exam is overall reassuring and it does not seem like you hit your head but no one is really sure about this.  If you start developing severe headaches, vision changes, nausea or vomiting, confusion, memory difficulties please go to the emergency room for  evaluation  If you develop any of the following I recommend going to the emergency room for further evaluation: Shortness of breath, difficulty breathing, fever or chills that are not responding to Tylenol  and ibuprofen, confusion, severe headache, vision changes, persistent nausea and vomiting.     ED Prescriptions     Medication Sig Dispense Auth. Provider   amoxicillin -clavulanate (AUGMENTIN ) 875-125 MG tablet Take 1 tablet by mouth every 12 (twelve) hours. 14 tablet Hiro Vipond E, PA-C   azithromycin  (ZITHROMAX ) 250 MG tablet Take 2 tablets (500 mg total) by mouth daily for 1 day, THEN 1 tablet (250 mg total) daily for 4 days. 6 each Rubbie Goostree E, PA-C   predniSONE  (DELTASONE ) 20 MG tablet Take 2 tablets (40 mg total) by mouth daily for 5 days. 10 tablet Alejandra Barna E, PA-C      PDMP not reviewed this encounter.   Marylene Rocky BRAVO, PA-C 01/23/24 [id number]

## 2024-01-23 NOTE — Discharge Instructions (Addendum)
 You were seen today for concerns of a persistent, productive cough that has been ongoing for over a week.  Your exam was notable for decreased breath sounds at the bases of your lungs and wheezing so I am concerned that you may be developing pneumonia.  Your chest x-ray is notable for opacities that may be consistent with pneumonia so I am starting you on a medication regimen to treat this.  This is comprised of 2 antibiotics called Augmentin  and azithromycin .  You should take the Augmentin  by mouth twice per day for 7 days.  The azithromycin  also known as a Z-Pak should be taken per the instructions on the package. To help reduce your coughing and improve your breathing I am sending in a steroid called prednisone  for you to take by mouth once per day in the morning with breakfast.  I think it is important to note that during your exam I did hear a moderate heart murmur.  You have an upcoming appointment with cardiology in August so I suggest that you keep this appointment to make sure that everything is okay with your heart  I have sent in a script for Prednisone  burst to be taken in the morning with breakfast per the instructions on the container Remember that steroids can cause sleeplessness, irritability, increased hunger and elevated glucose levels so be mindful of these side effects.   As needed you can continue to take over-the-counter medications such as DayQuil/NyQuil, TheraFlu, Alka-Seltzer or regular Robitussin and Mucinex to assist with the coughing and discomfort.  You can take over-the-counter Tylenol  as needed for pain and fever control.  Your family members report that you fell earlier today.  Your neurological exam is overall reassuring and it does not seem like you hit your head but no one is really sure about this.  If you start developing severe headaches, vision changes, nausea or vomiting, confusion, memory difficulties please go to the emergency room for evaluation  If you develop  any of the following I recommend going to the emergency room for further evaluation: Shortness of breath, difficulty breathing, fever or chills that are not responding to Tylenol  and ibuprofen, confusion, severe headache, vision changes, persistent nausea and vomiting.

## 2024-01-23 NOTE — ED Triage Notes (Addendum)
 Pt accompanied by son on today's visit.   Pt presents with complaint of cough and sore throat x 1 week. Feels tightness in chest only after coughing. Currently rates his throat pain a 10/10. Mucus in the back of throat. Mucinex taken with improvement.   Son also states patient fell in grocery store this morning at 0930. Denies head injury. With his spouse at the time, son did not witness. Pt states he does not remember falling. Son believes he lost his balance but is not sure.

## 2024-01-24 ENCOUNTER — Ambulatory Visit: Payer: Self-pay

## 2024-01-25 ENCOUNTER — Ambulatory Visit (HOSPITAL_COMMUNITY): Payer: Self-pay

## 2024-02-15 ENCOUNTER — Encounter: Payer: Self-pay | Admitting: Cardiology

## 2024-02-15 ENCOUNTER — Ambulatory Visit: Attending: Cardiology | Admitting: Cardiology

## 2024-02-15 VITALS — BP 162/82 | HR 67 | Ht 65.0 in | Wt 135.0 lb

## 2024-02-15 DIAGNOSIS — I10 Essential (primary) hypertension: Secondary | ICD-10-CM

## 2024-02-15 DIAGNOSIS — Z136 Encounter for screening for cardiovascular disorders: Secondary | ICD-10-CM | POA: Diagnosis not present

## 2024-02-15 NOTE — Progress Notes (Signed)
" °  Cardiology Office Note:  .   Date:  02/15/2024  ID:  George Hodges, DOB January 17, 1961, MRN [id number] PCP: Lorren Greig PARAS, NP  [facility] Providers Cardiologist:  Newman Lawrence, MD PCP: Lorren Greig PARAS, NP  Chief Complaint  Patient presents with   Hypertension     George Hodges is a 63 y.o. male with hypertension  Patient's daughter assisted with language interpretation.  Discussed the use of AI scribe software for clinical note transcription with the patient, who gave verbal consent to proceed.  History of Present Illness George Hodges is a 63 year old male with hypertension who presents with a suspected heart murmur.  A heart murmur was suggested during a clinical visit.  He has hypertension and is on valsartan , though he missed his dose on the day of the visit. He leads an active lifestyle, engaging in gardening and working in a packaging area at [employer]. There is no family history of heart problems, and he does not smoke. No additional symptoms are noted in the review of systems.      Vitals:   02/15/24 1551  BP: (!) 162/82  Pulse: 67  SpO2: 96%      Review of Systems  Cardiovascular:  Negative for chest pain, dyspnea on exertion, leg swelling, palpitations and syncope.        Studies Reviewed: SABRA        EKG 02/15/2024: Normal sinus rhythm Nonspecific T wave abnormality When compared with ECG of 07-Apr-2010 15:58, Vent. rate has decreased BY  36 BPM Nonspecific T wave abnormality no longer evident in Anterior leads  Labs 09/2023: Chol 168, TG 142, HDL 56, LDL 87 Hb 15.1 Cr 9.18  03/2023: HbA1C 5.6%  Physical Exam Vitals and nursing note reviewed.  Constitutional:      General: He is not in acute distress. Neck:     Vascular: No JVD.  Cardiovascular:     Rate and Rhythm: Normal rate and regular rhythm.     Heart sounds: Normal heart sounds. No murmur heard. Pulmonary:     Effort: Pulmonary effort is normal.     Breath sounds: Normal  breath sounds. No wheezing or rales.  Musculoskeletal:     Right lower leg: No edema.     Left lower leg: No edema.      VISIT DIAGNOSES:   ICD-10-CM   1. Primary hypertension  I10 EKG 12-Lead    ECHOCARDIOGRAM COMPLETE    2. Encounter for screening for coronary artery disease  Z13.6 CT CARDIAC SCORING (SELF PAY ONLY)       George Hodges is a 63 y.o. male with hypertension  Assessment & Plan Essential hypertension Blood pressure elevated; valsartan  missed today. Lifestyle changes recommended. - Continue valsartan . - Encourage regular physical activity. - Advise heart-healthy diet. - Check echocardiogram.  No prominent heart on my exam today. - Also recommend coronary calcium score scan for screening for coronary artery disease given hypertension.  Follow-up in 4 to 6 weeks to reassess blood pressure and discuss echocardiogram findings.     F/u in 4-6 weeks  Signed, Newman PARAS Lawrence, MD  "

## 2024-02-15 NOTE — Patient Instructions (Addendum)
 Medication Instructions:  No medication changes were made at this visit. Continue current regimen.   Testing/Procedures: Your physician has requested that you have an echocardiogram. Echocardiography is a painless test that uses sound waves to create images of your heart. It provides your doctor with information about the size and shape of your heart and how well your heart's chambers and valves are working. This procedure takes approximately one hour. There are no restrictions for this procedure. Please do NOT wear cologne, perfume, aftershave, or lotions (deodorant is allowed). Please arrive 15 minutes prior to your appointment time.  Please note: We ask at that you not bring children with you during ultrasound (echo/ vascular) testing. Due to room size and safety concerns, children are not allowed in the ultrasound rooms during exams. Our front office staff cannot provide observation of children in our lobby area while testing is being conducted. An adult accompanying a patient to their appointment will only be allowed in the ultrasound room at the discretion of the ultrasound technician under special circumstances. We apologize for any inconvenience.   CT scanning for a cardiac calcium score (CAT scanning), is a noninvasive, special x-ray that produces cross-sectional images of the body using x-rays and a computer. CT scans help physicians diagnose and treat medical conditions. For some CT exams, a contrast material is used to enhance visibility in the area of the body being studied. CT scans provide greater clarity and reveal more details than regular x-ray exams. This is not covered by insurance and will be an out-of-pocket cost of approximately $99.   Follow-Up:  Your next appointment:   4-6 week(s)  Provider:   APPs

## 2024-02-16 ENCOUNTER — Encounter: Payer: Self-pay | Admitting: Cardiology

## 2024-02-16 DIAGNOSIS — Z136 Encounter for screening for cardiovascular disorders: Secondary | ICD-10-CM | POA: Insufficient documentation

## 2024-02-16 DIAGNOSIS — I10 Essential (primary) hypertension: Secondary | ICD-10-CM | POA: Insufficient documentation

## 2024-03-28 ENCOUNTER — Ambulatory Visit (HOSPITAL_COMMUNITY)
Admission: RE | Admit: 2024-03-28 | Discharge: 2024-03-28 | Disposition: A | Discharge status: Home / Self Care | Source: Ambulatory Visit | Attending: Cardiology

## 2024-03-28 ENCOUNTER — Ambulatory Visit (HOSPITAL_COMMUNITY)
Admission: RE | Admit: 2024-03-28 | Discharge: 2024-03-28 | Disposition: A | Discharge status: Home / Self Care | Source: Ambulatory Visit | Attending: Cardiology | Admitting: Cardiology

## 2024-03-28 DIAGNOSIS — I10 Essential (primary) hypertension: Secondary | ICD-10-CM | POA: Insufficient documentation

## 2024-03-28 DIAGNOSIS — I700 Atherosclerosis of aorta: Secondary | ICD-10-CM | POA: Insufficient documentation

## 2024-03-28 DIAGNOSIS — Z136 Encounter for screening for cardiovascular disorders: Secondary | ICD-10-CM | POA: Insufficient documentation

## 2024-03-29 ENCOUNTER — Ambulatory Visit: Payer: Self-pay | Admitting: Cardiology

## 2024-03-29 LAB — ECHOCARDIOGRAM COMPLETE
Area-P 1/2: 2.33 cm2
S' Lateral: 4.49 cm

## 2024-03-29 NOTE — Progress Notes (Signed)
 95th percentile calcification, including left main.  Recommend aspirin, statin and risk factor modification. In addition, could consider ischemia testing. Patient is seeing you later this week.  Thanks MJP

## 2024-03-30 ENCOUNTER — Encounter: Payer: Self-pay | Admitting: *Deleted

## 2024-04-01 ENCOUNTER — Ambulatory Visit: Attending: Emergency Medicine | Admitting: Emergency Medicine

## 2024-04-01 NOTE — Progress Notes (Deleted)
  Cardiology Office Note:    Date:  04/01/2024  ID:  George Hodges, DOB [birth year], MRN [id number] PCP: Lorren Greig PARAS, NP  [facility] Providers Cardiologist:  Newman PARAS Lawrence, MD { Click to update primary MD,subspecialty MD or APP then REFRESH:1}    {Click to Open Review  :1}   Patient Profile:       Chief Complaint: *** History of Present Illness:  George Hodges is a 63 y.o. male with visit-pertinent history of hypertension  Patient established with cardiology service on 02/15/2024 with Dr. Lawrence.  He presented with a suspected heart murmur.  He leads an active lifestyle engaging in gardening and working in a package area at [employer].  He had no family history of heart problems and does not smoke.  His blood pressure is elevated 162/82.  He did miss his valsartan  dose that morning.  He was to monitor blood pressures at home.  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  CT cardiac scoring 03/28/2024 showed coronary calcium score 663 (95th percentile).  Discussed the use of AI scribe software for clinical note transcription with the patient, who gave verbal consent to proceed.  History of Present Illness     Review of systems:  Please see the history of present illness. All other systems are reviewed and otherwise negative. ***      Studies Reviewed:        ***  Risk Assessment/Calculations:   {Does this patient have ATRIAL FIBRILLATION?:[phone]} No BP recorded.  {Refresh Note OR Click here to enter BP  :1}***        Physical Exam:   VS:  There were no vitals taken for this visit.   Wt Readings from Last 3 Encounters:  02/15/24 135 lb (61.2 kg)  01/23/24 130 lb 1.1 oz (59 kg)   09/15/23 130 lb (59 kg)    GEN: Well nourished, well developed in no acute distress NECK: No JVD; No carotid bruits CARDIAC: ***RRR, no murmurs, rubs, gallops RESPIRATORY:  Clear to auscultation without rales, wheezing or rhonchi  ABDOMEN: Soft, non-tender, non-distended EXTREMITIES:  No edema; No acute deformity ***      Assessment and Plan:    Assessment and Plan Assessment & Plan      {Are you ordering a CV Procedure (e.g. stress test, cath, DCCV, TEE, etc)?   Press F2        :[id number]}  Dispo:  No follow-ups on file.  Signed, Lum LITTIE Louis, NP

## 2024-05-15 ENCOUNTER — Other Ambulatory Visit (HOSPITAL_COMMUNITY): Payer: Self-pay

## 2024-05-16 ENCOUNTER — Other Ambulatory Visit: Payer: Self-pay | Admitting: Family

## 2024-05-16 DIAGNOSIS — I10 Essential (primary) hypertension: Secondary | ICD-10-CM

## 2024-05-16 NOTE — Telephone Encounter (Unsigned)
 Copied from CRM [id number]. Topic: Clinical - Medication Refill >> May 16, 2024  8:08 AM Robinson H wrote: Medication: amLODipine  (NORVASC ) 5 MG tablet, valsartan  (DIOVAN ) 80 MG tablet   Has the patient contacted their pharmacy? Yes, switching to a different pharmacy (Agent: If no, request that the patient contact the pharmacy for the refill. If patient does not wish to contact the pharmacy document the reason why and proceed with request.) (Agent: If yes, when and what did the pharmacy advise?)  This is the patient's preferred pharmacy:    [facility] [address] [place] [address] Phone: [phone] Fax: [phone]  Is this the correct pharmacy for this prescription? Yes If no, delete pharmacy and type the correct one.   Has the prescription been filled recently? No  Is the patient out of the medication? Yes  Has the patient been seen for an appointment in the last year OR does the patient have an upcoming appointment? Yes  Can we respond through MyChart? Yes  Agent: Please be advised that Rx refills may take up to 3 business days. We ask that you follow-up with your pharmacy.

## 2024-05-17 ENCOUNTER — Other Ambulatory Visit (HOSPITAL_COMMUNITY): Payer: Self-pay

## 2024-05-17 MED ORDER — VALSARTAN 80 MG PO TABS
80.0000 mg | ORAL_TABLET | Freq: Every day | ORAL | 0 refills | Status: DC
  Filled 2024-05-17: qty 90, 90d supply, fill #0

## 2024-05-17 MED ORDER — AMLODIPINE BESYLATE 5 MG PO TABS
5.0000 mg | ORAL_TABLET | Freq: Every day | ORAL | 0 refills | Status: DC
  Filled 2024-05-17: qty 90, 90d supply, fill #0

## 2024-05-17 NOTE — Telephone Encounter (Signed)
 Requested Prescriptions  Pending Prescriptions Disp Refills   amLODipine  (NORVASC ) 5 MG tablet 90 tablet 0    Sig: Take 1 tablet (5 mg total) by mouth daily.     Cardiovascular: Calcium Channel Blockers 2 Failed - 05/17/2024  3:32 PM      Failed - Last BP in normal range    BP Readings from Last 1 Encounters:  02/15/24 (!) 162/82         Failed - Valid encounter within last 6 months    Recent Outpatient Visits           8 months ago Primary hypertension   [facility], [place], [place]   1 year ago Primary hypertension   [facility], [place], [place]   1 year ago Primary hypertension   [facility], [place], [place]   1 year ago Primary hypertension   [facility], [place], [place]   1 year ago Encounter to establish care   [facility], [place] J, NP              Passed - Last Heart Rate in normal range    Pulse Readings from Last 1 Encounters:  02/15/24 67          valsartan  (DIOVAN ) 80 MG tablet 90 tablet 0    Sig: Take 1 tablet (80 mg total) by mouth daily.     Cardiovascular:  Angiotensin Receptor Blockers Failed - 05/17/2024  3:32 PM      Failed - Cr in normal range and within 180 days    Creatinine, Ser  Date Value Ref Range Status  09/15/2023 0.81 0.76 - 1.27 mg/dL Final         Failed - K in normal range and within 180 days    Potassium  Date Value Ref Range Status  09/15/2023 4.9 3.5 - 5.2 mmol/L Final         Failed - Last BP in normal range    BP Readings from Last 1 Encounters:  02/15/24 (!) 162/82         Failed - Valid encounter within last 6 months    Recent Outpatient Visits           8 months ago Primary hypertension   [facility], [place]   1 year ago Primary hypertension   [facility], [place], [place]   1 year ago Primary hypertension   [facility], [place], [place]   1 year ago Primary hypertension   [facility], [place]   1 year ago Encounter to establish care   [facility], Greig PARAS, NP              Passed - Patient is not pregnant

## 2024-06-28 ENCOUNTER — Other Ambulatory Visit (HOSPITAL_COMMUNITY): Payer: Self-pay

## 2024-06-28 ENCOUNTER — Ambulatory Visit
Admission: RE | Admit: 2024-06-28 | Discharge: 2024-06-28 | Disposition: A | Discharge status: Home / Self Care | Source: Ambulatory Visit | Attending: Emergency Medicine | Admitting: Emergency Medicine

## 2024-06-28 VITALS — BP 122/65 | HR 72 | Temp 97.6°F | Resp 16

## 2024-06-28 DIAGNOSIS — B002 Herpesviral gingivostomatitis and pharyngotonsillitis: Secondary | ICD-10-CM

## 2024-06-28 LAB — POC COVID19/FLU A&B COMBO
Covid Antigen, POC: NEGATIVE
Influenza A Antigen, POC: NEGATIVE
Influenza B Antigen, POC: NEGATIVE

## 2024-06-28 MED ORDER — VALACYCLOVIR HCL 1 G PO TABS
1000.0000 mg | ORAL_TABLET | Freq: Two times a day (BID) | ORAL | 0 refills | Status: AC
  Filled 2024-06-28: qty 14, 7d supply, fill #0

## 2024-06-28 NOTE — Discharge Instructions (Signed)
 Your COVID and flu test were both negative Most likely you have a viral illness We are treating you for oral herpes simplex infection, take Valtrex  as prescribed Follow-up with PCP If you have new or worsening symptoms go to the emergency room for further evaluation

## 2024-06-28 NOTE — ED Triage Notes (Addendum)
 Pt c/o congestion, chills, body aches, and slight cough for two days.

## 2024-06-28 NOTE — ED Provider Notes (Signed)
 [facility]    CSN: [id number] Arrival date & time: 06/28/24  1715      History   Chief Complaint Chief Complaint  Patient presents with   Nasal Congestion    Fever, chills, cough, body aches - Entered by patient    HPI George Hodges is a 63 y.o. male.   George Hodges, 63 year old male patient, presents to urgent care with daughter for evaluation of congestion, chills , and body aches for 2 days.  Patient also has lesion to left upper lip since symptoms started    The history is provided by the patient and a caregiver. No language interpreter was used.    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 Authorizing Provider  valACYclovir  (VALTREX ) 1000 MG tablet Take 1 tablet (1,000 mg total) by mouth 2 (two) times daily for 7 days. 06/28/24 07/05/24 Yes Nichols Corter, Rilla, NP  amLODipine  (NORVASC ) 5 MG tablet Take 1 tablet (5 mg total) by mouth daily. 05/17/24   Jaycee Greig PARAS, NP  amoxicillin -clavulanate (AUGMENTIN ) 875-125 MG tablet Take 1 tablet by mouth every 12 (twelve) hours. Patient not taking: Reported on 06/28/2024 01/23/24   Mecum, Erin E, PA-C  benzonatate  (TESSALON ) 100 MG capsule Take 1 capsule (100 mg total) by mouth every 8 (eight) hours. Patient not taking: Reported on 09/15/2023 04/06/23   Billy Asberry FALCON, PA-C  loratadine (CLARITIN) 10 MG tablet Take 10 mg by mouth daily.    [provider]  Pseudoephedrine-APAP-DM (DAYQUIL PO) Take by mouth.    [provider]  valsartan  (DIOVAN ) 80 MG tablet Take 1 tablet (80 mg total) by mouth daily. 05/07/23 02/15/24  Jaycee Greig PARAS, NP  valsartan  (DIOVAN ) 80 MG tablet Take 1 tablet (80 mg total) by mouth daily. 05/17/24   Jaycee Greig PARAS, NP    Family History History reviewed. No pertinent family history.  Social History Social History[1]   Allergies   Patient has no known allergies.   Review of Systems Review of Systems  Constitutional:  Positive for fever.  HENT:  Positive for congestion.   Respiratory:  Positive for cough.   Musculoskeletal:  Positive for myalgias.  Skin:  Positive for rash.  All other systems reviewed and are negative.    Physical Exam Triage Vital Signs ED Triage Vitals [06/28/24 1736]  Encounter Vitals Group     BP      Girls Systolic BP Percentile      Girls Diastolic BP Percentile      Boys Systolic BP Percentile      Boys Diastolic BP Percentile      Pulse      Resp      Temp      Temp src      SpO2  Weight      Height      Head Circumference      Peak Flow      Pain Score 4     Pain Loc      Pain Education      Exclude from Growth Chart    No data found.  Updated Vital Signs BP 122/65 (BP Location: Right Arm)   Pulse 72   Temp 97.6 F (36.4 C) (Oral)   Resp 16   SpO2 97%   Visual Acuity Right Eye Distance:   Left Eye Distance:   Bilateral Distance:    Right Eye Near:   Left Eye Near:    Bilateral Near:     Physical Exam Vitals and nursing note reviewed.  Constitutional:      General: He is not in acute distress.    Appearance: He is well-developed and well-groomed.  HENT:     Head: Normocephalic and atraumatic.     Right Ear: Tympanic membrane is retracted.     Left Ear: Tympanic membrane is retracted.     Nose: Congestion present.      Mouth/Throat:     Lips: Pink.     Mouth: Mucous membranes are moist. Oral lesions present.     Pharynx: Oropharynx is clear. Uvula midline.   Eyes:     Conjunctiva/sclera: Conjunctivae normal.  Cardiovascular:     Rate and Rhythm: Normal rate and regular rhythm.     Heart sounds: Normal heart sounds. No murmur heard. Pulmonary:     Effort: Pulmonary effort is normal. No respiratory distress.     Breath sounds: Normal breath sounds and air entry.  Abdominal:     Palpations: Abdomen is soft.     Tenderness: There is no abdominal tenderness.  Musculoskeletal:        General: No swelling.     Cervical back: Neck supple.  Skin:    General: Skin is warm and dry.     Capillary Refill: Capillary refill takes less than 2 seconds.  Neurological:     General: No focal deficit present.     Mental Status: He is alert and oriented to person, place, and time.     GCS: GCS eye subscore is 4. GCS verbal subscore is 5. GCS motor subscore is 6.  Psychiatric:        Attention and Perception: Attention normal.        Mood and Affect: Mood normal.        Speech: Speech normal.        Behavior: Behavior normal. Behavior is cooperative.      UC Treatments / Results  Labs (all labs ordered are listed, but only abnormal results are displayed) Labs Reviewed  POC COVID19/FLU A&B COMBO - Normal    EKG   Radiology No results found.  Procedures Procedures (including critical care time)  Medications Ordered in UC Medications - No data to display  Initial Impression / Assessment and Plan / UC Course  I have reviewed the triage vital signs and the nursing notes.  Pertinent labs & imaging results that were available during my care of the patient were reviewed by me and considered in my medical decision making (see chart for details).    Discussed exam findings and plan of care with patient, Valtrex  scripted, strict go to ER precautions given.   Patient verbalized understanding to this  provider.  Ddx: Viral URI with cough, HSV, allergies Final Clinical Impressions(s) / UC Diagnoses  Final diagnoses:  Oral herpes simplex infection     Discharge Instructions      Your COVID and flu test were both negative Most likely you have a viral illness We are treating you for oral herpes simplex infection, take Valtrex  as prescribed Follow-up with PCP If you have new or worsening symptoms go to the emergency room for further evaluation     ED Prescriptions     Medication Sig Dispense Auth. Provider   valACYclovir  (VALTREX ) 1000 MG tablet Take 1 tablet (1,000 mg total) by mouth 2 (two) times daily for 7 days. 14 tablet Deana Krock, Rilla, NP      PDMP not reviewed this encounter.     [1]  Social History Tobacco Use   Smoking status: Former    Types: Cigarettes   Smokeless tobacco: Never  Vaping Use   Vaping status: Never Used  Substance Use Topics   Alcohol use: Not Currently    Comment: 1-2 cans of beer daily   Drug use: No     Trason Shifflet, Rilla, NP 06/28/24 1822

## 2024-07-09 ENCOUNTER — Ambulatory Visit (INDEPENDENT_AMBULATORY_CARE_PROVIDER_SITE_OTHER)

## 2024-07-09 ENCOUNTER — Ambulatory Visit
Admission: RE | Admit: 2024-07-09 | Discharge: 2024-07-09 | Disposition: A | Discharge status: Home / Self Care | Payer: Self-pay | Source: Ambulatory Visit | Attending: Family Medicine | Admitting: Family Medicine

## 2024-07-09 ENCOUNTER — Other Ambulatory Visit (HOSPITAL_COMMUNITY): Payer: Self-pay

## 2024-07-09 ENCOUNTER — Ambulatory Visit: Payer: Self-pay | Admitting: Nurse Practitioner

## 2024-07-09 VITALS — BP 151/85 | HR 72 | Temp 98.6°F | Resp 16

## 2024-07-09 DIAGNOSIS — R051 Acute cough: Secondary | ICD-10-CM

## 2024-07-09 DIAGNOSIS — J22 Unspecified acute lower respiratory infection: Secondary | ICD-10-CM

## 2024-07-09 MED ORDER — PROMETHAZINE-DM 6.25-15 MG/5ML PO SYRP
5.0000 mL | ORAL_SOLUTION | Freq: Every evening | ORAL | 0 refills | Status: AC | PRN
  Filled 2024-07-09: qty 118, 23d supply, fill #0

## 2024-07-09 MED ORDER — AMOXICILLIN-POT CLAVULANATE 875-125 MG PO TABS
1.0000 | ORAL_TABLET | Freq: Two times a day (BID) | ORAL | 0 refills | Status: AC
  Filled 2024-07-09: qty 14, 7d supply, fill #0

## 2024-07-09 MED ORDER — AZITHROMYCIN 250 MG PO TABS
ORAL_TABLET | ORAL | 0 refills | Status: AC
  Filled 2024-07-09: qty 6, 5d supply, fill #0

## 2024-07-09 NOTE — ED Triage Notes (Signed)
 Patient was at urgent care about two weeks with body aches, fever, and cough, cough has gotten worst and had chest pain from the cough,  patient was pale last night,  patient has been coughing up yellow mucous,  patient was on antivirals for a cold sore.

## 2024-07-09 NOTE — ED Provider Notes (Addendum)
 " [facility]    CSN: [id number] Arrival date & time: 07/09/24  0857      History   Chief Complaint Chief Complaint  Patient presents with   Cough    Worsening cough, recent urgent care visit. - Entered by patient    HPI George Hodges is a 63 y.o. male  presents for evaluation of URI symptoms for 2 weeks.  Patient is accompanied by daughter who does help to augment history.  Patient reports associated symptoms of productive cough with bodyaches, chest pain with coughing.  Daughter states he looked pale last night and temperature was lower around 96.  States he looks better today.  Denies N/V/D, fevers, sore throat, ear pain. Patient does not have a hx of asthma. Patient is a previous smoker that quit 30 years ago, no history of COPD.  Reports no known sick contacts.  Pt has taken teas/rest OTC for symptoms.  Patient was seen in urgent care on 12/16 for 2 days of same symptoms.  Had negative COVID and flu and was treated for oral HSV with valacyclovir .  Patient daughter states cough has worsened.  BP noted to be elevated on intake but he did not take his BP meds today per daughter.  Pt has no other concerns at this time.    Cough Associated symptoms: myalgias     Past Medical History:  Diagnosis Date   CKD (chronic kidney disease)    HTN (hypertension)    Prediabetes 12/03/2022    Patient Active Problem List   Diagnosis Date Noted   Oral herpes simplex infection 06/28/2024   Primary hypertension 02/16/2024   Encounter for screening for coronary artery disease 02/16/2024   Prediabetes 12/03/2022    Past Surgical History:  Procedure Laterality Date   APPENDECTOMY     TENDON REPAIR Left 03/08/2014   Procedure: Left Wrist Wound Exploration with Tendon and Nerve Repair;  Surgeon: Prentice LELON Pagan, MD;  Location: [facility] OR;  Service: Orthopedics;  Laterality: Left;       Home Medications    Prior to Admission medications  Medication Sig Start Date End Date Taking?  Authorizing Provider  amoxicillin -clavulanate (AUGMENTIN ) 875-125 MG tablet Take 1 tablet by mouth every 12 (twelve) hours. 07/09/24  Yes Carston Riedl, Jodi R, NP  azithromycin  (ZITHROMAX ) 250 MG tablet Take 1 tablet (250 mg total) by mouth daily. Take first 2 tablets together, then 1 every day until finished. 07/09/24  Yes Daijon Wenke, Jodi R, NP  promethazine -dextromethorphan (PROMETHAZINE -DM) 6.25-15 MG/5ML syrup Take 5 mLs by mouth at bedtime as needed for cough. 07/09/24  Yes Kriss Perleberg, Jodi R, NP  amLODipine  (NORVASC ) 5 MG tablet Take 1 tablet (5 mg total) by mouth daily. 05/17/24   Jaycee Greig PARAS, NP  benzonatate  (TESSALON ) 100 MG capsule Take 1 capsule (100 mg total) by mouth every 8 (eight) hours. Patient not taking: Reported on 09/15/2023 04/06/23   Billy Asberry FALCON, PA-C  loratadine (CLARITIN) 10 MG tablet Take 10 mg by mouth daily.    [provider]  valsartan  (DIOVAN ) 80 MG tablet Take 1 tablet (80 mg total) by mouth daily. 05/07/23 02/15/24  Jaycee Greig PARAS, NP  valsartan  (DIOVAN ) 80 MG tablet Take 1 tablet (80 mg total) by mouth daily. 05/17/24   Jaycee Greig PARAS, NP    Family History History reviewed. No pertinent family history.  Social History Social History[1]   Allergies   Patient has no known allergies.   Review of Systems Review of Systems  HENT:  Positive for congestion.   Respiratory:  Positive for cough.        Chest pain with coughing   Musculoskeletal:  Positive for myalgias.     Physical Exam Triage Vital Signs ED Triage Vitals  Encounter Vitals Group     BP 07/09/24 0909 (!) 151/85     Girls Systolic BP Percentile --      Girls Diastolic BP Percentile --      Boys Systolic BP Percentile --      Boys Diastolic BP Percentile --      Pulse Rate 07/09/24 0909 72     Resp 07/09/24 0909 16     Temp 07/09/24 0909 98.6 F (37 C)     Temp Source 07/09/24 0909 Oral     SpO2 07/09/24 0909 95 %     Weight --      Height --      Head Circumference --      Peak Flow --       Pain Score 07/09/24 0906 4     Pain Loc --      Pain Education --      Exclude from Growth Chart --    No data found.  Updated Vital Signs BP (!) 151/85 (BP Location: Right Arm) Comment: patient has not taken his b/p medication today  Pulse 72   Temp 98.6 F (37 C) (Oral)   Resp 16   SpO2 95%   Visual Acuity Right Eye Distance:   Left Eye Distance:   Bilateral Distance:    Right Eye Near:   Left Eye Near:    Bilateral Near:     Physical Exam Vitals and nursing note reviewed.  Constitutional:      General: He is not in acute distress.    Appearance: Normal appearance. He is not ill-appearing or toxic-appearing.  HENT:     Head: Normocephalic and atraumatic.     Right Ear: Tympanic membrane and ear canal normal.     Left Ear: Tympanic membrane and ear canal normal.     Nose: Congestion present.     Mouth/Throat:     Mouth: Mucous membranes are moist.     Pharynx: No oropharyngeal exudate or posterior oropharyngeal erythema.  Eyes:     Pupils: Pupils are equal, round, and reactive to light.  Cardiovascular:     Rate and Rhythm: Normal rate and regular rhythm.     Heart sounds: Normal heart sounds.  Pulmonary:     Effort: Pulmonary effort is normal.     Breath sounds: Normal breath sounds.     Comments: Faint expiratory wheeze right lower lobe Musculoskeletal:     Cervical back: Normal range of motion and neck supple.  Lymphadenopathy:     Cervical: No cervical adenopathy.  Skin:    General: Skin is warm and dry.  Neurological:     General: No focal deficit present.     Mental Status: He is alert and oriented to person, place, and time.  Psychiatric:        Mood and Affect: Mood normal.        Behavior: Behavior normal.      UC Treatments / Results  Labs (all labs ordered are listed, but only abnormal results are displayed) Labs Reviewed - No data to display  Basic Metabolic Panel Order: [id number]  Status: Final result     Next appt: 07/12/2024  at 08:40 AM in Family Medicine (Amy JINNY Chute, NP)  Dx: Primary hypertension; Chronic kidney ...   Test Result Released: Yes (seen)     Messages: Seen   2 Result Notes     1 Patient Communication     View Follow-Up Encounter          Component Ref Range & Units (hover) 9 mo ago (09/15/23) 1 yr ago (04/06/23) 1 yr ago (12/16/22) 1 yr ago (12/02/22) 1 yr ago (10/31/22) 10 yr ago (03/08/14) 14 yr ago (04/07/10)  Glucose 95 106 High  76 89 87 88 113 High   BUN 16 15 35 High  11 15 16  R 9 R  Creatinine, Ser 0.81 1.12 2.11 High  0.86 0.77 0.90 R 0.78 R  eGFR 98 73 35 Low  97 101    BUN/Creatinine Ratio 20 13 17 13 19     Sodium 140 137 137 143 141 140 R 137 R  Potassium 4.9 5.0 3.7 4.1 4.1 4.1 R 3.7 R  Chloride 104 100 96 105 105 102 R 103 R  CO2 25 23 27 24 25 24  R 26 R  Calcium 9.6 8.9 9.2 9.3 9.0 9.5 R 9.0 R  Resulting Agency [facility]         Narrative Performed by: HOYT Performed at:  [address] [facility] [address] [place], [place]  [id number] Lab Director: Frankey Sas MD, Phone:  [phone]  Specimen Collected: 09/15/23 08:35 Last Resulted: 09/16/23 03:35    EKG   Radiology No results found.  Procedures Procedures (including critical care time)  Medications Ordered in UC Medications - No data to display  Initial Impression / Assessment and Plan / UC Course  I have reviewed the triage vital signs and the nursing notes.  Pertinent labs & imaging results that were available during my care of the patient were reviewed by me and considered in my medical decision making (see chart for details).     Reviewed exam and symptoms with patient and daughter.  Wet read of x-ray concerning for possible pneumonia, will do Augmentin  and azithromycin .  Promethazine  DM as needed for cough, side effect profile reviewed.  Patient and daughter state Tessalon  does not work for him.  Encourage rest fluids and PCP  follow-up 2 to 3 days for recheck.  Also advised to repeat chest x-ray 4 weeks with PCP to confirm resolution of the infection.  ER precautions reviewed and patient daughter verbalized understanding. Final Clinical Impressions(s) / UC Diagnoses   Final diagnoses:  Acute cough  Lower respiratory infection (e.g., bronchitis, pneumonia, pneumonitis, pulmonitis)     Discharge Instructions      Start Augmentin  and azithromycin  antibiotics as prescribed.  You may take Promethazine  DM as needed for your cough.  Please note this medication can make you drowsy.  Do not drive or drink alcohol while on this medication.  Lots of rest and fluids and follow-up with your PCP in 2 to 3 days for recheck.  It is also recommended that you have a repeat chest x-ray in 4 weeks to confirm resolution of the infection.  Please go to emergency room if you develop any worsening symptoms.  I hope you feel better soon!     ED Prescriptions     Medication Sig Dispense Auth. Provider   amoxicillin -clavulanate (AUGMENTIN ) 875-125 MG tablet Take 1 tablet by mouth every 12 (twelve) hours. 14 tablet Kaina Orengo, Jodi R, NP   azithromycin  (ZITHROMAX ) 250 MG tablet Take 1 tablet (250 mg  total) by mouth daily. Take first 2 tablets together, then 1 every day until finished. 6 tablet Brihana Quickel, Jodi R, NP   promethazine -dextromethorphan (PROMETHAZINE -DM) 6.25-15 MG/5ML syrup Take 5 mLs by mouth at bedtime as needed for cough. 118 mL Sofya Moustafa, Jodi R, NP      PDMP not reviewed this encounter.    Loreda Myla SAUNDERS, NP 07/09/24 [phone]     [1]  Social History Tobacco Use   Smoking status: Former    Types: Cigarettes   Smokeless tobacco: Never  Vaping Use   Vaping status: Never Used  Substance Use Topics   Alcohol use: Not Currently    Comment: 1-2 cans of beer daily   Drug use: No     Loreda Myla SAUNDERS, NP 07/09/24 [phone]  "

## 2024-07-09 NOTE — Discharge Instructions (Signed)
 Start Augmentin  and azithromycin  antibiotics as prescribed.  You may take Promethazine  DM as needed for your cough.  Please note this medication can make you drowsy.  Do not drive or drink alcohol while on this medication.  Lots of rest and fluids and follow-up with your PCP in 2 to 3 days for recheck.  It is also recommended that you have a repeat chest x-ray in 4 weeks to confirm resolution of the infection.  Please go to emergency room if you develop any worsening symptoms.  I hope you feel better soon!

## 2024-07-12 ENCOUNTER — Ambulatory Visit: Admitting: Family

## 2024-07-12 ENCOUNTER — Encounter: Payer: Self-pay | Admitting: Family

## 2024-07-12 ENCOUNTER — Other Ambulatory Visit

## 2024-07-12 ENCOUNTER — Telehealth: Payer: Self-pay | Admitting: *Deleted

## 2024-07-12 ENCOUNTER — Ambulatory Visit (INDEPENDENT_AMBULATORY_CARE_PROVIDER_SITE_OTHER): Admitting: Family

## 2024-07-12 VITALS — BP 123/80 | HR 71 | Temp 98.3°F | Resp 16 | Ht 63.0 in | Wt 132.6 lb

## 2024-07-12 DIAGNOSIS — J22 Unspecified acute lower respiratory infection: Secondary | ICD-10-CM

## 2024-07-12 DIAGNOSIS — J849 Interstitial pulmonary disease, unspecified: Secondary | ICD-10-CM | POA: Diagnosis not present

## 2024-07-12 DIAGNOSIS — R051 Acute cough: Secondary | ICD-10-CM | POA: Diagnosis not present

## 2024-07-12 NOTE — Progress Notes (Signed)
 "   Patient ID: George Hodges, male    DOB: [birth year]  MRN: [id number]  CC: Urgent Care Follow-Up  Subjective: George Hodges is a 63 y.o. male who presents for Urgent Care follow-up. He is accompanied by his daughter.  His concerns today include:  Patient seen on 07/09/2024 (59 minutes) at [facility] at [facility]) for lower respiratory infection and additional diagnosis. Today reports cough persisting. He denies red flag symptoms. He is taking medications as prescribed from Urgent Care. Daughter states patient did not go to work today however patient was provided with a return to work letter (return on 07/12/2024) during recent Urgent Care visit.   Patient Active Problem List   Diagnosis Date Noted   Oral herpes simplex infection 06/28/2024   Primary hypertension 02/16/2024   Encounter for screening for coronary artery disease 02/16/2024   Prediabetes 12/03/2022     Medications Ordered Prior to Encounter[1]  Allergies[2]  Social History   Socioeconomic History   Marital status: Married    Spouse name: Not on file   Number of children: Not on file   Years of education: Not on file   Highest education level: Not on file  Occupational History   Not on file  Tobacco Use   Smoking status: Former    Types: Cigarettes   Smokeless tobacco: Never  Vaping Use   Vaping status: Never Used  Substance and Sexual Activity   Alcohol use: Not Currently    Comment: 1-2 cans of beer daily   Drug use: No   Sexual activity: Not Currently  Other Topics Concern   Not on file  Social History Narrative   Not on file   Social Drivers of Health   Tobacco Use: Medium Risk (07/12/2024)   Patient History    Smoking Tobacco Use: Former    Smokeless Tobacco Use: Never    Passive Exposure: Not on Actuary Strain: Not on file  Food Insecurity: Not on file  Transportation Needs: Not on file  Physical Activity: Not on file  Stress: Not on file  Social  Connections: Unknown (11/22/2021)   Received from [facility]   Social Network    Social Network: Not on file  Intimate Partner Violence: Unknown (10/14/2021)   Received from [facility]   HITS    Physically Hurt: Not on file    Insult or Talk Down To: Not on file    Threaten Physical Harm: Not on file    Scream or Curse: Not on file  Depression (PHQ2-9): Low Risk (07/12/2024)   Depression (PHQ2-9)    PHQ-2 Score: 0  Alcohol Screen: Not on file  Housing: Not on file  Utilities: Not on file  Health Literacy: Not on file    No family history on file.  Past Surgical History:  Procedure Laterality Date   APPENDECTOMY     TENDON REPAIR Left 03/08/2014   Procedure: Left Wrist Wound Exploration with Tendon and Nerve Repair;  Surgeon: Prentice LELON Pagan, MD;  Location: [facility] OR;  Service: Orthopedics;  Laterality: Left;    ROS: Review of Systems Negative except as stated above  PHYSICAL EXAM: BP 123/80 (BP Location: Left Arm, Patient Position: Sitting)   Pulse 71   Temp 98.3 F (36.8 C) (Oral)   Resp 16   Ht 5' 3 (1.6 m)   Wt 132 lb 9.6 oz (60.1 kg)   SpO2 96%   BMI 23.49 kg/m   Physical Exam HENT:  Head: Normocephalic and atraumatic.     Nose: Nose normal.     Mouth/Throat:     Mouth: Mucous membranes are moist.     Pharynx: Oropharynx is clear.  Eyes:     Extraocular Movements: Extraocular movements intact.     Conjunctiva/sclera: Conjunctivae normal.     Pupils: Pupils are equal, round, and reactive to light.  Cardiovascular:     Rate and Rhythm: Normal rate and regular rhythm.     Pulses: Normal pulses.     Heart sounds: Normal heart sounds.  Pulmonary:     Effort: Pulmonary effort is normal.     Breath sounds: Normal breath sounds.  Musculoskeletal:        General: Normal range of motion.     Cervical back: Normal range of motion and neck supple.  Neurological:     General: No focal deficit present.     Mental Status: He is alert and oriented to person,  place, and time.  Psychiatric:        Mood and Affect: Mood normal.        Behavior: Behavior normal.     ASSESSMENT AND PLAN: 1. Lower respiratory infection (Primary) 2. Acute cough 3. Chronic interstitial lung disease (HCC) - Patient today in office with no cardiopulmonary/acute distress.  - Continue present management.  - Patient/patient's daughter declined respiratory panel and additional screening.  - Recent chest xray from 07/09/2024 resulted chronic interstitial lung disease and no acute cardiopulmonary findings. - Chest xray future dated for 4 weeks from initial chest xray. - Referral to Pulmonology for evaluation/management.  - Patient provided with return to work letter (return on 07/12/2024) during recent Urgent Care visit on 07/09/2024. - Patient provided with work letter for today's office visit.  - Follow-up with primary provider as scheduled.  - Ambulatory referral to Pulmonology - DG Chest 2 View; Future   Patient was given the opportunity to ask questions.  Patient verbalized understanding of the plan and was able to repeat key elements of the plan. Patient was given clear instructions to go to Emergency Department or return to medical center if symptoms don't improve, worsen, or new problems develop.The patient verbalized understanding.   Orders Placed This Encounter  Procedures   DG Chest 2 View   Ambulatory referral to Pulmonology    Return for Follow-up as needed.  Greig JINNY Chute, NP      [1]  Current Outpatient Medications on File Prior to Visit  Medication Sig Dispense Refill   amLODipine  (NORVASC ) 5 MG tablet Take 1 tablet (5 mg total) by mouth daily. 90 tablet 0   amoxicillin -clavulanate (AUGMENTIN ) 875-125 MG tablet Take 1 tablet by mouth every 12 (twelve) hours. 14 tablet 0   azithromycin  (ZITHROMAX ) 250 MG tablet Take 2 tablets (500 mg total) by mouth daily for 1 day, THEN 1 tablet (250 mg total) daily for 4 days. 6 tablet 0   loratadine  (CLARITIN) 10 MG tablet Take 10 mg by mouth daily.     promethazine -dextromethorphan (PROMETHAZINE -DM) 6.25-15 MG/5ML syrup Take 5 mLs by mouth at bedtime as needed for cough. 118 mL 0   valsartan  (DIOVAN ) 80 MG tablet Take 1 tablet (80 mg total) by mouth daily. 90 tablet 0   benzonatate  (TESSALON ) 100 MG capsule Take 1 capsule (100 mg total) by mouth every 8 (eight) hours. (Patient not taking: Reported on 09/15/2023) 21 capsule 0   valsartan  (DIOVAN ) 80 MG tablet Take 1 tablet (80 mg total) by mouth daily. 90  tablet 0   No current facility-administered medications on file prior to visit.  [2] No Known Allergies  "

## 2024-07-12 NOTE — Telephone Encounter (Signed)
 Left message on voicemail to return call.   Left message to have patient call office.  He needs to return to office for XRay that was ordered by PCP. He left prior to having XRay.

## 2024-07-20 ENCOUNTER — Ambulatory Visit: Admitting: Family

## 2024-08-05 ENCOUNTER — Ambulatory Visit: Admitting: Pulmonary Disease

## 2024-08-08 ENCOUNTER — Ambulatory Visit: Admitting: Family

## 2024-08-08 ENCOUNTER — Other Ambulatory Visit (HOSPITAL_COMMUNITY): Payer: Self-pay

## 2024-08-08 ENCOUNTER — Other Ambulatory Visit: Payer: Self-pay | Admitting: Family

## 2024-08-08 DIAGNOSIS — I10 Essential (primary) hypertension: Secondary | ICD-10-CM

## 2024-08-08 MED ORDER — VALSARTAN 80 MG PO TABS
80.0000 mg | ORAL_TABLET | Freq: Every day | ORAL | 0 refills | Status: AC
  Filled 2024-08-08: qty 90, 90d supply, fill #0

## 2024-08-08 MED ORDER — AMLODIPINE BESYLATE 5 MG PO TABS
5.0000 mg | ORAL_TABLET | Freq: Every day | ORAL | 0 refills | Status: AC
  Filled 2024-08-08: qty 90, 90d supply, fill #0

## 2024-08-09 ENCOUNTER — Other Ambulatory Visit (HOSPITAL_COMMUNITY): Payer: Self-pay

## 2024-08-17 ENCOUNTER — Ambulatory Visit: Admitting: Pulmonary Disease

## 2024-09-06 ENCOUNTER — Ambulatory Visit: Admitting: Family
